# Patient Record
Sex: Male | Born: 1985 | Race: Black or African American | Hispanic: No | Marital: Single | Smoking: Current every day smoker
Health system: Southern US, Community
[De-identification: ages and names within clinical notes are randomized; demographics above are authoritative.]

---
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
## 2004-01-22 ENCOUNTER — Emergency Department (HOSPITAL_COMMUNITY): Admission: EM | Admit: 2004-01-22 | Discharge: 2004-01-22 | Payer: Self-pay | Admitting: Emergency Medicine

## 2004-02-11 ENCOUNTER — Emergency Department (HOSPITAL_COMMUNITY): Admission: EM | Admit: 2004-02-11 | Discharge: 2004-02-11 | Payer: Self-pay | Admitting: Family Medicine

## 2004-07-11 ENCOUNTER — Emergency Department (HOSPITAL_COMMUNITY): Admission: EM | Admit: 2004-07-11 | Discharge: 2004-07-11 | Payer: Self-pay | Admitting: Family Medicine

## 2004-11-12 ENCOUNTER — Emergency Department (HOSPITAL_COMMUNITY): Admission: EM | Admit: 2004-11-12 | Discharge: 2004-11-12 | Payer: Self-pay | Admitting: Emergency Medicine

## 2005-12-27 ENCOUNTER — Emergency Department (HOSPITAL_COMMUNITY): Admission: EM | Admit: 2005-12-27 | Discharge: 2005-12-27 | Payer: Self-pay | Admitting: Emergency Medicine

## 2006-10-13 ENCOUNTER — Emergency Department: Payer: Self-pay | Admitting: Unknown Physician Specialty

## 2009-12-07 ENCOUNTER — Emergency Department: Payer: Self-pay | Admitting: Emergency Medicine

## 2010-06-14 ENCOUNTER — Emergency Department: Payer: Self-pay | Admitting: Emergency Medicine

## 2011-12-25 ENCOUNTER — Emergency Department: Payer: Self-pay | Admitting: Emergency Medicine

## 2011-12-25 LAB — URINALYSIS, COMPLETE
Bacteria: NONE SEEN
Bilirubin,UR: NEGATIVE
Blood: NEGATIVE
Glucose,UR: NEGATIVE mg/dL (ref 0–75)
Ketone: NEGATIVE
Leukocyte Esterase: NEGATIVE
Nitrite: NEGATIVE
Ph: 5 (ref 4.5–8.0)
Protein: NEGATIVE
RBC,UR: <1 /HPF (ref 0–5)
Specific Gravity: 1.03 (ref 1.003–1.030)
Squamous Epithelial: NONE SEEN
WBC UR: 1 /HPF (ref 0–5)

## 2013-07-20 ENCOUNTER — Emergency Department: Payer: Self-pay | Admitting: Emergency Medicine

## 2013-09-23 ENCOUNTER — Emergency Department: Payer: Self-pay | Admitting: Emergency Medicine

## 2013-09-25 ENCOUNTER — Emergency Department: Payer: Self-pay | Admitting: Emergency Medicine

## 2014-03-21 ENCOUNTER — Emergency Department: Payer: Self-pay | Admitting: Internal Medicine

## 2014-10-05 ENCOUNTER — Encounter: Payer: Self-pay | Admitting: Emergency Medicine

## 2014-10-05 ENCOUNTER — Emergency Department
Admission: EM | Admit: 2014-10-05 | Discharge: 2014-10-05 | Disposition: A | Discharge status: Home / Self Care | Payer: Self-pay | Attending: Emergency Medicine | Admitting: Emergency Medicine

## 2014-10-05 DIAGNOSIS — R7309 Other abnormal glucose: Secondary | ICD-10-CM | POA: Insufficient documentation

## 2014-10-05 DIAGNOSIS — M2550 Pain in unspecified joint: Secondary | ICD-10-CM | POA: Insufficient documentation

## 2014-10-05 DIAGNOSIS — R7303 Prediabetes: Secondary | ICD-10-CM

## 2014-10-05 DIAGNOSIS — Z720 Tobacco use: Secondary | ICD-10-CM | POA: Insufficient documentation

## 2014-10-05 DIAGNOSIS — Z880 Allergy status to penicillin: Secondary | ICD-10-CM | POA: Insufficient documentation

## 2014-10-05 LAB — CBC WITH DIFFERENTIAL/PLATELET
BASOS PCT: 0 %
Basophils Absolute: 0 10*3/uL (ref 0–0.1)
Eosinophils Absolute: 0.1 10*3/uL (ref 0–0.7)
Eosinophils Relative: 2 %
HCT: 44.8 % (ref 40.0–52.0)
Hemoglobin: 14.5 g/dL (ref 13.0–18.0)
Lymphocytes Relative: 12 %
Lymphs Abs: 0.8 10*3/uL — ABNORMAL LOW (ref 1.0–3.6)
MCH: 27.1 pg (ref 26.0–34.0)
MCHC: 32.3 g/dL (ref 32.0–36.0)
MCV: 83.9 fL (ref 80.0–100.0)
Monocytes Absolute: 0.7 10*3/uL (ref 0.2–1.0)
Monocytes Relative: 11 %
Neutro Abs: 5.2 10*3/uL (ref 1.4–6.5)
Neutrophils Relative %: 75 %
Platelets: 205 10*3/uL (ref 150–440)
RBC: 5.34 MIL/uL (ref 4.40–5.90)
RDW: 13.9 % (ref 11.5–14.5)
WBC: 6.8 10*3/uL (ref 3.8–10.6)

## 2014-10-05 LAB — BASIC METABOLIC PANEL
Anion gap: 4 — ABNORMAL LOW (ref 5–15)
BUN: 11 mg/dL (ref 6–20)
CO2: 28 mmol/L (ref 22–32)
CREATININE: 1.18 mg/dL (ref 0.61–1.24)
Calcium: 8.9 mg/dL (ref 8.9–10.3)
Chloride: 108 mmol/L (ref 101–111)
GFR calc Af Amer: >60 mL/min (ref >60)
GFR calc non Af Amer: >60 mL/min (ref >60)
Glucose, Bld: 136 mg/dL — ABNORMAL HIGH (ref 65–99)
Potassium: 4.5 mmol/L (ref 3.5–5.1)
Sodium: 140 mmol/L (ref 135–145)

## 2014-10-05 MED ORDER — IBUPROFEN 800 MG PO TABS: 800.0000 mg | ORAL_TABLET | Freq: Three times a day (TID) | ORAL | Status: DC | PRN

## 2014-10-05 MED ORDER — KETOROLAC TROMETHAMINE 30 MG/ML IJ SOLN
30.0000 mg | Freq: Once | INTRAMUSCULAR | Status: AC
  Administered 2014-10-05: 30 mg via INTRAVENOUS
  Filled 2014-10-05: qty 1

## 2014-10-05 MED ORDER — ORPHENADRINE CITRATE 30 MG/ML IJ SOLN
60.0000 mg | Freq: Two times a day (BID) | INTRAMUSCULAR | Status: DC
  Administered 2014-10-05: 60 mg via INTRAVENOUS
  Filled 2014-10-05: qty 2

## 2014-10-05 MED ORDER — SODIUM CHLORIDE 0.9 % IV BOLUS (SEPSIS)
1000.0000 mL | Freq: Once | INTRAVENOUS | Status: AC
  Administered 2014-10-05: 1000 mL via INTRAVENOUS

## 2014-10-05 MED ORDER — CYCLOBENZAPRINE HCL 10 MG PO TABS: 10.0000 mg | ORAL_TABLET | Freq: Three times a day (TID) | ORAL | Status: DC | PRN

## 2014-10-05 MED ORDER — CLINDAMYCIN PHOSPHATE 600 MG/50ML IV SOLN: 600.0000 mg | Freq: Once | INTRAVENOUS | Status: DC

## 2014-10-05 NOTE — ED Provider Notes (Signed)
[facility] Emergency Department Provider Note  ____________________________________________  Time seen: Approximately 9:30 AM  I have reviewed the triage vital signs and the nursing notes.   HISTORY  Chief Complaint Muscle Pain    HPI Preston Edwards is a 29 y.o. male patient is complaining of generalized myalgia with no provocative incident. Patient said he was seen 2 days ago by the medical urgent care clinicand diagnosed with a viral illness. Patient say taken some ibuprofen and states child to go to work yesterday and was too weak to continue. Patient denies any headache, URI signs and symptoms, or nausea vomiting or diarrhea. Patient say his upper and lower extremities all weak and his whole body aches. Patient denies any headache vertigo or vision disturbance.   History reviewed. No pertinent past medical history.  There are no active problems to display for this patient.   History reviewed. No pertinent past surgical history.  Current Outpatient Rx  Name  Route  Sig  Dispense  Refill  . cyclobenzaprine (FLEXERIL) 10 MG tablet   Oral   Take 1 tablet (10 mg total) by mouth every 8 (eight) hours as needed for muscle spasms.   15 tablet   0   . ibuprofen (ADVIL,MOTRIN) 800 MG tablet   Oral   Take 1 tablet (800 mg total) by mouth every 8 (eight) hours as needed for moderate pain.   15 tablet   0     Allergies Penicillins  History reviewed. No pertinent family history.  Social History Social History  Substance Use Topics  . Smoking status: Current Every Day Smoker  . Smokeless tobacco: None  . Alcohol Use: Yes    Review of Systems Constitutional: No fever/chills Eyes: No visual changes. ENT: No sore throat. Cardiovascular: Denies chest pain. Respiratory: Denies shortness of breath. Gastrointestinal: No abdominal pain.  No nausea, no vomiting.  No diarrhea.  No constipation. Genitourinary: Negative for dysuria. Musculoskeletal:  Myalgia  Skin: Negative for rash. Neurological: Negative for headaches, focal weakness or numbness. Allergic/Immunilogical: Penicillin 10-point ROS otherwise negative.  ____________________________________________   PHYSICAL EXAM:  VITAL SIGNS: ED Triage Vitals  Enc Vitals Group     BP 10/05/14 0924 145/66 mmHg     Pulse Rate 10/05/14 0924 108     Resp 10/05/14 0924 18     Temp 10/05/14 0924 98.7 F (37.1 C)     Temp Source 10/05/14 0924 Oral     SpO2 10/05/14 0924 96 %     Weight 10/05/14 0924 247 lb (112.038 kg)     Height 10/05/14 0924 6' (1.829 m)     Head Cir --      Peak Flow --      Pain Score --      Pain Loc --      Pain Edu? --      Excl. in GC? --     Constitutional: Alert and oriented. Well appearing and in no acute distress. Eyes: Conjunctivae are normal. PERRL. EOMI. Head: Atraumatic. Nose: No congestion/rhinnorhea. Mouth/Throat: Mucous membranes are moist.  Oropharynx non-erythematous. Neck: No stridor.  No cervical spine tenderness to palpation. Hematological/Lymphatic/Immunilogical: No cervical lymphadenopathy. Cardiovascular: Normal rate, regular rhythm. Grossly normal heart sounds.  Good peripheral circulation. Respiratory: Normal respiratory effort.  No retractions. Lungs CTAB. Gastrointestinal: Soft and nontender. No distention. No abdominal bruits. No CVA tenderness. Musculoskeletal: No lower extremity tenderness nor edema.  No joint effusions. Neurologic:  Normal speech and language. No gross focal neurologic deficits are appreciated.  No gait instability. Skin:  Skin is warm, dry and intact. No rash noted. Psychiatric: Mood and affect are normal. Speech and behavior are normal.  ____________________________________________   LABS (all labs ordered are listed, but only abnormal results are displayed)  Labs Reviewed  BASIC METABOLIC PANEL - Abnormal; Notable for the following:    Glucose, Bld 136 (*)    Anion gap 4 (*)    All other components  within normal limits  CBC WITH DIFFERENTIAL/PLATELET - Abnormal; Notable for the following:    Lymphs Abs 0.8 (*)    All other components within normal limits   ____________________________________________  EKG   ____________________________________________  RADIOLOGY   ____________________________________________   PROCEDURES  Procedure(s) performed: None  Critical Care performed: No  ____________________________________________   INITIAL IMPRESSION / ASSESSMENT AND PLAN / ED COURSE  Pertinent labs & imaging results that were available during my care of the patient were reviewed by me and considered in my medical decision making (see chart for details).  Discussed the results with patient showing he might be prediabetic especially with his strong family history. Patient states noticed improvement after rehydration, Toradol, and Norflex. Patient will be discharged prescription for ibuprofen and Flexeril. Patient also [facility] for [facility] evaluation was elevated blood sugar. ____________________________________________   FINAL CLINICAL IMPRESSION(S) / ED DIAGNOSES  Final diagnoses:  Arthralgia  Prediabetes      Joni Reining, PA-C 10/05/14 1045  Jennye Moccasin, MD 10/05/14 1141

## 2014-10-05 NOTE — Discharge Instructions (Signed)

## 2014-10-05 NOTE — ED Notes (Signed)
Pt to ed with c/o muscle pain and low grade fever earlier in the week, was seen at [facility] for same.  Pt states yesterday started feeling pain in joints and muscles again, pt also reports abd pain.  States pain was worse in neck, arms, thighs and abd.  Pt without fever today. Alert and oriented and skin warm and dry.

## 2015-02-05 ENCOUNTER — Emergency Department
Admission: EM | Admit: 2015-02-05 | Discharge: 2015-02-05 | Disposition: A | Discharge status: Home / Self Care | Payer: Self-pay | Attending: Emergency Medicine | Admitting: Emergency Medicine

## 2015-02-05 DIAGNOSIS — F17200 Nicotine dependence, unspecified, uncomplicated: Secondary | ICD-10-CM | POA: Insufficient documentation

## 2015-02-05 DIAGNOSIS — Z880 Allergy status to penicillin: Secondary | ICD-10-CM | POA: Insufficient documentation

## 2015-02-05 DIAGNOSIS — Y990 Civilian activity done for income or pay: Secondary | ICD-10-CM | POA: Insufficient documentation

## 2015-02-05 DIAGNOSIS — Y9389 Activity, other specified: Secondary | ICD-10-CM | POA: Insufficient documentation

## 2015-02-05 DIAGNOSIS — S39012A Strain of muscle, fascia and tendon of lower back, initial encounter: Secondary | ICD-10-CM | POA: Insufficient documentation

## 2015-02-05 DIAGNOSIS — X500XXA Overexertion from strenuous movement or load, initial encounter: Secondary | ICD-10-CM | POA: Insufficient documentation

## 2015-02-05 DIAGNOSIS — Y9289 Other specified places as the place of occurrence of the external cause: Secondary | ICD-10-CM | POA: Insufficient documentation

## 2015-02-05 MED ORDER — IBUPROFEN 800 MG PO TABS: 800.0000 mg | ORAL_TABLET | Freq: Three times a day (TID) | ORAL | Status: DC | PRN

## 2015-02-05 MED ORDER — IBUPROFEN 800 MG PO TABS
800.0000 mg | ORAL_TABLET | Freq: Once | ORAL | Status: AC
  Administered 2015-02-05: 800 mg via ORAL
  Filled 2015-02-05: qty 1

## 2015-02-05 MED ORDER — DIAZEPAM 5 MG PO TABS
5.0000 mg | ORAL_TABLET | Freq: Once | ORAL | Status: AC
  Administered 2015-02-05: 5 mg via ORAL
  Filled 2015-02-05: qty 1

## 2015-02-05 MED ORDER — METHOCARBAMOL 500 MG PO TABS: 500.0000 mg | ORAL_TABLET | Freq: Four times a day (QID) | ORAL | Status: DC | PRN

## 2015-02-05 MED ORDER — HYDROCODONE-ACETAMINOPHEN 5-325 MG PO TABS
2.0000 | ORAL_TABLET | Freq: Once | ORAL | Status: AC
  Administered 2015-02-05: 2 via ORAL
  Filled 2015-02-05: qty 2

## 2015-02-05 NOTE — ED Notes (Signed)
Pt states he was lifting heavy lumbar today and began having left lower back pain with a hx of sciatica

## 2015-02-05 NOTE — Discharge Instructions (Signed)

## 2015-02-05 NOTE — ED Notes (Addendum)
Pt reports back pain at work when lifting wood at work.  This pain happens 2x per year and keeps him from being able to work. Pain did not start until today. Pt has not taken anything for pain. Has taken tylenol and Excedrin but nothing is helping.

## 2015-02-05 NOTE — ED Provider Notes (Signed)
[facility] Emergency Department Provider Note  ____________________________________________  Time seen: Approximately 1:53 PM  I have reviewed the triage vital signs and the nursing notes.   HISTORY  Chief Complaint Back Pain    HPI RAWN QUIROA is a 30 y.o. male who was lifting with work when his partner dropped the other end and the vibrations along with the way that would performed on his lower back. Patient states has a history of sciatica.   History reviewed. No pertinent past medical history.  There are no active problems to display for this patient.   History reviewed. No pertinent past surgical history.  Current Outpatient Rx  Name  Route  Sig  Dispense  Refill  . ibuprofen (ADVIL,MOTRIN) 800 MG tablet   Oral   Take 1 tablet (800 mg total) by mouth every 8 (eight) hours as needed.   30 tablet   0   . methocarbamol (ROBAXIN) 500 MG tablet   Oral   Take 1 tablet (500 mg total) by mouth every 6 (six) hours as needed for muscle spasms.   30 tablet   0     Allergies Penicillins  No family history on file.  Social History Social History  Substance Use Topics  . Smoking status: Current Every Day Smoker  . Smokeless tobacco: None  . Alcohol Use: Yes    Review of Systems Constitutional: No fever/chills Eyes: No visual changes. ENT: No sore throat. Cardiovascular: Denies chest pain. Respiratory: Denies shortness of breath. Gastrointestinal: No abdominal pain.  No nausea, no vomiting.  No diarrhea.  No constipation. Genitourinary: Negative for dysuria. Musculoskeletal: Positive for low back pain. Skin: Negative for rash. Neurological: Negative for headaches, focal weakness or numbness.  10-point ROS otherwise negative.  ____________________________________________   PHYSICAL EXAM:  VITAL SIGNS: ED Triage Vitals  Enc Vitals Group     BP 02/05/15 1328 125/73 mmHg     Pulse Rate 02/05/15 1328 102     Resp 02/05/15  1328 16     Temp 02/05/15 1328 97.7 F (36.5 C)     Temp Source 02/05/15 1328 Oral     SpO2 02/05/15 1328 97 %     Weight 02/05/15 1328 245 lb (111.131 kg)     Height 02/05/15 1328 6' (1.829 m)     Head Cir --      Peak Flow --      Pain Score 02/05/15 1328 8     Pain Loc --      Pain Edu? --      Excl. in GC? --     Constitutional: Alert and oriented. Well appearing and in no acute distress.   Cardiovascular: Normal rate, regular rhythm. Grossly normal heart sounds.  Good peripheral circulation. Respiratory: Normal respiratory effort.  No retractions. Lungs CTAB. Musculoskeletal: No lower extremity tenderness nor edema.  No joint effusions. Point tenderness to the right paraspinal lumbar area. Straight leg raise positive. Neurologic:  Normal speech and language. No gross focal neurologic deficits are appreciated. No gait instability. Skin:  Skin is warm, dry and intact. No rash noted. Psychiatric: Mood and affect are normal. Speech and behavior are normal.  ____________________________________________   LABS (all labs ordered are listed, but only abnormal results are displayed)  Labs Reviewed - No data to display    PROCEDURES  Procedure(s) performed: None  Critical Care performed: No  ____________________________________________   INITIAL IMPRESSION / ASSESSMENT AND PLAN / ED COURSE  Pertinent labs & imaging results that  were available during my care of the patient were reviewed by me and considered in my medical decision making (see chart for details).  Acute lumbar paraspinals muscular strain. Rx given for Robaxin 500 mg 4 times a day, Motrin 800 mg 3 times a day. Patient to follow-up with his PCP or return here with any worsening symptomology. Prior to discharge patient was given Valium 5 mg, Vicodin 5/325 #2 and Motrin 800 mg. Patient voices no other emergency medical complaints at this visit. ____________________________________________   FINAL CLINICAL  IMPRESSION(S) / ED DIAGNOSES  Final diagnoses:  Strain of lumbar paraspinal muscle, initial encounter      Evangeline Dakinharles M Eliane Hammersmith, PA-C 02/05/15 1409  Sharman CheekPhillip Stafford, MD 02/05/15 1523

## 2019-07-10 ENCOUNTER — Inpatient Hospital Stay: Payer: Self-pay

## 2019-07-10 ENCOUNTER — Inpatient Hospital Stay
Admission: EM | Admit: 2019-07-10 | Discharge: 2019-08-02 | DRG: 917 | Disposition: E | Payer: Self-pay | Attending: Internal Medicine | Admitting: Internal Medicine

## 2019-07-10 ENCOUNTER — Emergency Department: Payer: Self-pay

## 2019-07-10 DIAGNOSIS — Z66 Do not resuscitate: Secondary | ICD-10-CM | POA: Diagnosis present

## 2019-07-10 DIAGNOSIS — G931 Anoxic brain damage, not elsewhere classified: Secondary | ICD-10-CM | POA: Diagnosis present

## 2019-07-10 DIAGNOSIS — I255 Ischemic cardiomyopathy: Secondary | ICD-10-CM | POA: Diagnosis present

## 2019-07-10 DIAGNOSIS — E872 Acidosis, unspecified: Secondary | ICD-10-CM

## 2019-07-10 DIAGNOSIS — G92 Toxic encephalopathy: Secondary | ICD-10-CM | POA: Diagnosis present

## 2019-07-10 DIAGNOSIS — T405X1A Poisoning by cocaine, accidental (unintentional), initial encounter: Principal | ICD-10-CM

## 2019-07-10 DIAGNOSIS — N179 Acute kidney failure, unspecified: Secondary | ICD-10-CM | POA: Diagnosis present

## 2019-07-10 DIAGNOSIS — R571 Hypovolemic shock: Secondary | ICD-10-CM

## 2019-07-10 DIAGNOSIS — I609 Nontraumatic subarachnoid hemorrhage, unspecified: Secondary | ICD-10-CM | POA: Diagnosis present

## 2019-07-10 DIAGNOSIS — I6782 Cerebral ischemia: Secondary | ICD-10-CM

## 2019-07-10 DIAGNOSIS — Z20822 Contact with and (suspected) exposure to covid-19: Secondary | ICD-10-CM | POA: Diagnosis present

## 2019-07-10 DIAGNOSIS — T405X2A Poisoning by cocaine, intentional self-harm, initial encounter: Secondary | ICD-10-CM

## 2019-07-10 DIAGNOSIS — R739 Hyperglycemia, unspecified: Secondary | ICD-10-CM | POA: Diagnosis present

## 2019-07-10 DIAGNOSIS — Z6835 Body mass index (BMI) 35.0-35.9, adult: Secondary | ICD-10-CM

## 2019-07-10 DIAGNOSIS — R570 Cardiogenic shock: Secondary | ICD-10-CM

## 2019-07-10 DIAGNOSIS — E875 Hyperkalemia: Secondary | ICD-10-CM

## 2019-07-10 DIAGNOSIS — F1910 Other psychoactive substance abuse, uncomplicated: Secondary | ICD-10-CM | POA: Diagnosis present

## 2019-07-10 DIAGNOSIS — F17200 Nicotine dependence, unspecified, uncomplicated: Secondary | ICD-10-CM | POA: Diagnosis present

## 2019-07-10 DIAGNOSIS — J9601 Acute respiratory failure with hypoxia: Secondary | ICD-10-CM | POA: Diagnosis present

## 2019-07-10 DIAGNOSIS — J9602 Acute respiratory failure with hypercapnia: Secondary | ICD-10-CM | POA: Diagnosis present

## 2019-07-10 DIAGNOSIS — G936 Cerebral edema: Secondary | ICD-10-CM | POA: Diagnosis present

## 2019-07-10 DIAGNOSIS — I4901 Ventricular fibrillation: Secondary | ICD-10-CM | POA: Diagnosis present

## 2019-07-10 DIAGNOSIS — I468 Cardiac arrest due to other underlying condition: Secondary | ICD-10-CM | POA: Diagnosis present

## 2019-07-10 DIAGNOSIS — I469 Cardiac arrest, cause unspecified: Secondary | ICD-10-CM | POA: Diagnosis present

## 2019-07-10 LAB — BLOOD GAS, ARTERIAL
Acid-base deficit: 10.6 mmol/L — ABNORMAL HIGH (ref 0.0–2.0)
Acid-base deficit: 13.5 mmol/L — ABNORMAL HIGH (ref 0.0–2.0)
Bicarbonate: 22.1 mmol/L (ref 20.0–28.0)
Bicarbonate: 17.4 mmol/L — ABNORMAL LOW (ref 20.0–28.0)
FIO2: 0.95
FIO2: 1
MECHVT: 450 mL
MECHVT: 450 mL
Mechanical Rate: 18
O2 Saturation: 99.5 %
O2 Saturation: 60.9 %
PEEP: 5 cmH2O
PEEP: 12 cmH2O
Patient temperature: 37
Patient temperature: 37
RATE: 18 resp/min
RATE: 22 resp/min
pCO2 arterial: 80 mmHg (ref 32.0–48.0)
pCO2 arterial: 63 mmHg — ABNORMAL HIGH (ref 32.0–48.0)
pH, Arterial: 7.05 — CL (ref 7.350–7.450)
pH, Arterial: 7.05 — CL (ref 7.350–7.450)
pO2, Arterial: 223 mmHg — ABNORMAL HIGH (ref 83.0–108.0)
pO2, Arterial: 47 mmHg — ABNORMAL LOW (ref 83.0–108.0)

## 2019-07-10 LAB — CBC
HCT: 49.8 % (ref 39.0–52.0)
Hemoglobin: 15 g/dL (ref 13.0–17.0)
MCH: 29.4 pg (ref 26.0–34.0)
MCHC: 30.1 g/dL (ref 30.0–36.0)
MCV: 97.6 fL (ref 80.0–100.0)
Platelets: 182 10*3/uL (ref 150–400)
RBC: 5.1 MIL/uL (ref 4.22–5.81)
RDW: 14.9 % (ref 11.5–15.5)
WBC: 20.4 10*3/uL — ABNORMAL HIGH (ref 4.0–10.5)
nRBC: 0.1 % (ref 0.0–0.2)

## 2019-07-10 LAB — URINE DRUG SCREEN, QUALITATIVE (ARMC ONLY)
Amphetamines, Ur Screen: NOT DETECTED
Barbiturates, Ur Screen: NOT DETECTED
Benzodiazepine, Ur Scrn: NOT DETECTED
Cannabinoid 50 Ng, Ur ~~LOC~~: POSITIVE — AB
Cocaine Metabolite,Ur ~~LOC~~: POSITIVE — AB
MDMA (Ecstasy)Ur Screen: NOT DETECTED
Methadone Scn, Ur: NOT DETECTED
Opiate, Ur Screen: POSITIVE — AB
Phencyclidine (PCP) Ur S: NOT DETECTED
Tricyclic, Ur Screen: NOT DETECTED

## 2019-07-10 LAB — TROPONIN I (HIGH SENSITIVITY)
Troponin I (High Sensitivity): 102 ng/L (ref <18)
Troponin I (High Sensitivity): 1666 ng/L (ref <18)

## 2019-07-10 LAB — BASIC METABOLIC PANEL
Anion gap: 11 (ref 5–15)
BUN: 12 mg/dL (ref 6–20)
CO2: 33 mmol/L — ABNORMAL HIGH (ref 22–32)
Calcium: 8.3 mg/dL — ABNORMAL LOW (ref 8.9–10.3)
Chloride: 102 mmol/L (ref 98–111)
Creatinine, Ser: 1.67 mg/dL — ABNORMAL HIGH (ref 0.61–1.24)
GFR calc Af Amer: >60 mL/min (ref >60)
GFR calc non Af Amer: 52 mL/min — ABNORMAL LOW (ref >60)
Glucose, Bld: 168 mg/dL — ABNORMAL HIGH (ref 70–99)
Potassium: 6.6 mmol/L (ref 3.5–5.1)
Sodium: 146 mmol/L — ABNORMAL HIGH (ref 135–145)

## 2019-07-10 LAB — SARS CORONAVIRUS 2 BY RT PCR (HOSPITAL ORDER, PERFORMED IN ~~LOC~~ HOSPITAL LAB): SARS Coronavirus 2: NEGATIVE

## 2019-07-10 LAB — COMPREHENSIVE METABOLIC PANEL
ALT: 84 U/L — ABNORMAL HIGH (ref 0–44)
AST: 172 U/L — ABNORMAL HIGH (ref 15–41)
Albumin: 2.9 g/dL — ABNORMAL LOW (ref 3.5–5.0)
Alkaline Phosphatase: 96 U/L (ref 38–126)
Anion gap: 18 — ABNORMAL HIGH (ref 5–15)
BUN: 8 mg/dL (ref 6–20)
CO2: 22 mmol/L (ref 22–32)
Calcium: 9.3 mg/dL (ref 8.9–10.3)
Chloride: 97 mmol/L — ABNORMAL LOW (ref 98–111)
Creatinine, Ser: 1.77 mg/dL — ABNORMAL HIGH (ref 0.61–1.24)
GFR calc Af Amer: 56 mL/min — ABNORMAL LOW (ref >60)
GFR calc non Af Amer: 49 mL/min — ABNORMAL LOW (ref >60)
Glucose, Bld: 435 mg/dL — ABNORMAL HIGH (ref 70–99)
Potassium: 5.8 mmol/L — ABNORMAL HIGH (ref 3.5–5.1)
Sodium: 137 mmol/L (ref 135–145)
Total Bilirubin: 1.3 mg/dL — ABNORMAL HIGH (ref 0.3–1.2)
Total Protein: 6 g/dL — ABNORMAL LOW (ref 6.5–8.1)

## 2019-07-10 LAB — URINALYSIS, COMPLETE (UACMP) WITH MICROSCOPIC
Bilirubin Urine: NEGATIVE
Glucose, UA: >=500 mg/dL — AB
Hgb urine dipstick: NEGATIVE
Ketones, ur: NEGATIVE mg/dL
Leukocytes,Ua: NEGATIVE
Nitrite: NEGATIVE
Protein, ur: 100 mg/dL — AB
Specific Gravity, Urine: 1.016 (ref 1.005–1.030)
pH: 6 (ref 5.0–8.0)

## 2019-07-10 LAB — PHOSPHORUS: Phosphorus: 7.1 mg/dL — ABNORMAL HIGH (ref 2.5–4.6)

## 2019-07-10 LAB — MAGNESIUM: Magnesium: 2.4 mg/dL (ref 1.7–2.4)

## 2019-07-10 LAB — LACTIC ACID, PLASMA: Lactic Acid, Venous: 4 mmol/L (ref 0.5–1.9)

## 2019-07-10 LAB — GLUCOSE, CAPILLARY: Glucose-Capillary: 268 mg/dL — ABNORMAL HIGH (ref 70–99)

## 2019-07-10 LAB — ACETAMINOPHEN LEVEL: Acetaminophen (Tylenol), Serum: <10 ug/mL — ABNORMAL LOW (ref 10–30)

## 2019-07-10 LAB — MRSA PCR SCREENING: MRSA by PCR: NEGATIVE

## 2019-07-10 LAB — SALICYLATE LEVEL: Salicylate Lvl: <7 mg/dL — ABNORMAL LOW (ref 7.0–30.0)

## 2019-07-10 LAB — AMMONIA: Ammonia: 84 umol/L — ABNORMAL HIGH (ref 9–35)

## 2019-07-10 LAB — ETHANOL: Alcohol, Ethyl (B): <10 mg/dL (ref <10)

## 2019-07-10 MED ORDER — INSULIN ASPART 100 UNIT/ML IV SOLN
10.0000 [IU] | INTRAVENOUS | Status: AC
  Administered 2019-07-11: 10 [IU] via INTRAVENOUS
  Filled 2019-07-10: qty 0.1

## 2019-07-10 MED ORDER — DOCUSATE SODIUM 50 MG/5ML PO LIQD: 100.0000 mg | Freq: Two times a day (BID) | ORAL | Status: DC

## 2019-07-10 MED ORDER — ONDANSETRON HCL 4 MG/2ML IJ SOLN: 4.0000 mg | Freq: Four times a day (QID) | INTRAMUSCULAR | Status: DC | PRN

## 2019-07-10 MED ORDER — POLYETHYLENE GLYCOL 3350 17 G PO PACK: 17.0000 g | PACK | Freq: Every day | ORAL | Status: DC

## 2019-07-10 MED ORDER — CHLORHEXIDINE GLUCONATE CLOTH 2 % EX PADS
6.0000 | MEDICATED_PAD | Freq: Every day | CUTANEOUS | Status: DC
  Administered 2019-07-10: 6 via TOPICAL

## 2019-07-10 MED ORDER — INSULIN ASPART 100 UNIT/ML ~~LOC~~ SOLN
0.0000 [IU] | SUBCUTANEOUS | Status: DC
  Administered 2019-07-10: 8 [IU] via SUBCUTANEOUS
  Administered 2019-07-11: 2 [IU] via SUBCUTANEOUS
  Filled 2019-07-10 (×3): qty 1

## 2019-07-10 MED ORDER — FENTANYL CITRATE (PF) 100 MCG/2ML IJ SOLN: 50.0000 ug | INTRAMUSCULAR | Status: DC | PRN

## 2019-07-10 MED ORDER — DOCUSATE SODIUM 100 MG PO CAPS: 100.0000 mg | ORAL_CAPSULE | Freq: Two times a day (BID) | ORAL | Status: DC | PRN

## 2019-07-10 MED ORDER — SODIUM CHLORIDE 0.9% FLUSH: 3.0000 mL | INTRAVENOUS | Status: DC | PRN

## 2019-07-10 MED ORDER — SODIUM CHLORIDE 0.9 % IV BOLUS
1000.0000 mL | Freq: Once | INTRAVENOUS | Status: AC
  Administered 2019-07-10: 1000 mL via INTRAVENOUS

## 2019-07-10 MED ORDER — NOREPINEPHRINE 4 MG/250ML-% IV SOLN
0.0000 ug/min | INTRAVENOUS | Status: DC
  Administered 2019-07-10: 2 ug/min via INTRAVENOUS
  Administered 2019-07-10: 20 ug/min via INTRAVENOUS
  Filled 2019-07-10 (×2): qty 250

## 2019-07-10 MED ORDER — DEXTROSE 50 % IV SOLN
12.5000 g | INTRAVENOUS | Status: AC
  Administered 2019-07-11: 12.5 g via INTRAVENOUS
  Filled 2019-07-10: qty 50

## 2019-07-10 MED ORDER — FAMOTIDINE IN NACL 20-0.9 MG/50ML-% IV SOLN
20.0000 mg | Freq: Two times a day (BID) | INTRAVENOUS | Status: DC
  Administered 2019-07-10 – 2019-07-11 (×2): 20 mg via INTRAVENOUS
  Filled 2019-07-10 (×2): qty 50

## 2019-07-10 MED ORDER — DOCUSATE SODIUM 50 MG/5ML PO LIQD
100.0000 mg | Freq: Two times a day (BID) | ORAL | Status: DC
  Filled 2019-07-10: qty 10

## 2019-07-10 MED ORDER — NOREPINEPHRINE 16 MG/250ML-% IV SOLN
0.0000 ug/min | INTRAVENOUS | Status: DC
  Administered 2019-07-10 – 2019-07-11 (×3): 40 ug/min via INTRAVENOUS
  Filled 2019-07-10 (×3): qty 250

## 2019-07-10 MED ORDER — LABETALOL HCL 5 MG/ML IV SOLN
10.0000 mg | Freq: Once | INTRAVENOUS | Status: AC
  Administered 2019-07-10: 10 mg via INTRAVENOUS

## 2019-07-10 MED ORDER — STERILE WATER FOR INJECTION IV SOLN
INTRAVENOUS | Status: DC
  Filled 2019-07-10 (×5): qty 850

## 2019-07-10 MED ORDER — MIDAZOLAM HCL 2 MG/2ML IJ SOLN: 2.0000 mg | INTRAMUSCULAR | Status: DC | PRN

## 2019-07-10 MED ORDER — FENTANYL BOLUS VIA INFUSION
50.0000 ug | INTRAVENOUS | Status: DC | PRN
  Administered 2019-07-10: 50 ug via INTRAVENOUS
  Filled 2019-07-10: qty 50

## 2019-07-10 MED ORDER — ACETAMINOPHEN 325 MG PO TABS
650.0000 mg | ORAL_TABLET | ORAL | Status: DC | PRN
  Administered 2019-07-11: 650 mg via ORAL
  Filled 2019-07-10: qty 2

## 2019-07-10 MED ORDER — FENTANYL 2500MCG IN NS 250ML (10MCG/ML) PREMIX INFUSION: 50.0000 ug/h | INTRAVENOUS | Status: DC

## 2019-07-10 MED ORDER — VECURONIUM BROMIDE 10 MG IV SOLR
5.0000 mg | Freq: Once | INTRAVENOUS | Status: AC
  Administered 2019-07-10: 5 mg via INTRAVENOUS

## 2019-07-10 MED ORDER — POLYETHYLENE GLYCOL 3350 17 G PO PACK: 17.0000 g | PACK | Freq: Every day | ORAL | Status: DC | PRN

## 2019-07-10 MED ORDER — SODIUM CHLORIDE 0.9 % IV SOLN
250.0000 mL | INTRAVENOUS | Status: DC | PRN
  Administered 2019-07-10: 250 mL via INTRAVENOUS

## 2019-07-10 MED ORDER — FENTANYL CITRATE (PF) 100 MCG/2ML IJ SOLN: 50.0000 ug | Freq: Once | INTRAMUSCULAR | Status: DC

## 2019-07-10 MED ORDER — VASOPRESSIN 20 UNIT/ML IV SOLN
0.0300 [IU]/min | INTRAVENOUS | Status: DC
  Administered 2019-07-10: 0.03 [IU]/min via INTRAVENOUS
  Filled 2019-07-10 (×2): qty 2

## 2019-07-10 MED ORDER — SODIUM BICARBONATE 8.4 % IV SOLN
100.0000 meq | Freq: Once | INTRAVENOUS | Status: AC
  Administered 2019-07-10: 100 meq via INTRAVENOUS
  Filled 2019-07-10: qty 100

## 2019-07-10 MED ORDER — PHENYLEPHRINE CONCENTRATED 100MG/250ML (0.4 MG/ML) INFUSION SIMPLE
0.0000 ug/min | INTRAVENOUS | Status: DC
  Administered 2019-07-10: 20 ug/min via INTRAVENOUS
  Administered 2019-07-11: 400 ug/min via INTRAVENOUS
  Administered 2019-07-11: 350 ug/min via INTRAVENOUS
  Filled 2019-07-10 (×3): qty 250

## 2019-07-10 MED ORDER — FENTANYL 2500MCG IN NS 250ML (10MCG/ML) PREMIX INFUSION
INTRAVENOUS | Status: AC
  Administered 2019-07-10: 50 ug/h via INTRAVENOUS
  Filled 2019-07-10: qty 250

## 2019-07-10 MED ORDER — SODIUM BICARBONATE 8.4 % IV SOLN
100.0000 meq | Freq: Once | INTRAVENOUS | Status: AC
  Administered 2019-07-10: 100 meq via INTRAVENOUS

## 2019-07-10 MED ORDER — HEPARIN SODIUM (PORCINE) 5000 UNIT/ML IJ SOLN
5000.0000 [IU] | Freq: Three times a day (TID) | INTRAMUSCULAR | Status: DC
  Administered 2019-07-10: 5000 [IU] via SUBCUTANEOUS
  Filled 2019-07-10: qty 1

## 2019-07-10 MED ORDER — SODIUM CHLORIDE 0.9% FLUSH
3.0000 mL | Freq: Two times a day (BID) | INTRAVENOUS | Status: DC
  Administered 2019-07-10 – 2019-07-11 (×2): 3 mL via INTRAVENOUS

## 2019-07-10 NOTE — ED Provider Notes (Signed)
[facility] Emergency Department Provider Note  Time seen: 6:24 PM  I have reviewed the triage vital signs and the nursing notes.   HISTORY  Chief Complaint Cardiac arrest  HPI Preston Edwards is a 34 y.o. male with no known past medical history presents to the emergency department status post cardiac arrest.  According to EMS report they found the patient face down, CPR had been initiated by fire department upon their arrival.  Patient received several rounds of epinephrine, Narcan, an amp of calcium, amp of bicarb and defibrillated twice in V. fib.  Patient regained pulses and was brought to the emergency department via emergency traffic.  Here patient arrives unresponsive with pulse intact, fixed and dilated pupils.  Breaths are assisted with bagging via a Combitube.   No past medical history on file.  There are no problems to display for this patient.   No past surgical history on file.  Prior to Admission medications   Medication Sig Start Date End Date Taking? Authorizing Provider  ibuprofen (ADVIL,MOTRIN) 800 MG tablet Take 1 tablet (800 mg total) by mouth every 8 (eight) hours as needed. 02/05/15   Beers, Pierce Crane, PA-C  methocarbamol (ROBAXIN) 500 MG tablet Take 1 tablet (500 mg total) by mouth every 6 (six) hours as needed for muscle spasms. 02/05/15   Beers, Pierce Crane, PA-C    Allergies  Allergen Reactions  . Penicillins Other (See Comments)    Pt states he was just told this as a child, has never taken it as far as he knows.     No family history on file.  Social History Social History   Tobacco Use  . Smoking status: Current Every Day Smoker  Substance Use Topics  . Alcohol use: Yes  . Drug use: Not on file    Review of Systems Unable to obtain an adequate/accurate review of systems secondary to cardiac arrest ____________________________________________   PHYSICAL EXAM:  VITAL SIGNS: ED Triage Vitals  Enc Vitals Group     BP  07/26/2019 1812 (!) 90/38     Pulse Rate 07/28/2019 1812 (!) 115     Resp 07/09/2019 1812 (!) 24     Temp --      Temp src --      SpO2 07/28/2019 1812 96 %     Weight 07/26/2019 1817 285 lb 6.4 oz (129.5 kg)     Height 07/26/2019 1817 6\' 2"  (1.88 m)     Head Circumference --      Peak Flow --      Pain Score --      Pain Loc --      Pain Edu? --      Excl. in [place]? --    Constitutional: Patient is unresponsive to painful stimuli.  Currently being bagged with a Combitube in place.  Occasional agonal respiration Eyes: 4 mm fixed pupils bilaterally. ENT      Head: Normocephalic and atraumatic.      Mouth/Throat: Mucous membranes are moist. Cardiovascular: Normal rate, regular rhythm around 100 bpm. Respiratory: Agonal respirations, being bagged with a Combitube in place. Gastrointestinal: Soft, nondistended.  Obese. Musculoskeletal: Patient has bilateral IO access to the tibias. Neurologic: Unresponsive Skin:  Skin is warm, dry Psychiatric: Unresponsive  ____________________________________________    EKG  EKG viewed and interpreted by myself shows a sinus tachycardia 116 bpm the narrow QRS, normal axis, normal intervals nonspecific ST changes.  ____________________________________________    RADIOLOGY  X-ray shows endotracheal tube  3.3 cm above carina. CT scan head shows diffuse hypoxic ischemic injury with swelling.  ____________________________________________   INITIAL IMPRESSION / ASSESSMENT AND PLAN / ED COURSE  Pertinent labs & imaging results that were available during my care of the patient were reviewed by me and considered in my medical decision making (see chart for details).   Patient presents to the emergency department status post cardiac arrest.  Patient unresponsive upon arrival.  Is taken occasional agonal respiration via Combitube.  Combitube removed and the patient intubated by myself upon arrival.  Maintain pulse around 100 120 bpm.  Patient unfortunately  appears to have fixed dilated pupils upon arrival.  Blood pressure is somewhat hypotensive we will start on norepinephrine.  We will continue with IV hydration.  We will obtain chest x-ray to verify tube placement we will obtain CT imaging of the head as we do not have a clear cause for the arrest at this time.  Lab work is pending.  Attempting to contact family members.  I spoke to Dr. Belia Heman of ICU who will be admitting to his service.  Patient has become hypertensive, norepinephrine was discontinued patient remained hypertensive.  Patient given 10 mg of IV labetalol as his blood pressure currently is 213/144.  CT scan pending to evaluate for edema/herniation.  Continues to be maintaining spontaneous pulse.  Lab work shows hyperkalemia patient receiving IV fluids.  Elevated troponin to 102 however very likely due to prolonged CPR.  Liver function test elevated again very likely due to prolonged downtime.  Preston Edwards was evaluated in Emergency Department on 03-Aug-2019 for the symptoms described in the history of present illness. He was evaluated in the context of the global COVID-19 pandemic, which necessitated consideration that the patient might be at risk for infection with the SARS-CoV-2 virus that causes COVID-19. Institutional protocols and algorithms that pertain to the evaluation of patients at risk for COVID-19 are in a state of rapid change based on information released by regulatory bodies including the CDC and federal and state organizations. These policies and algorithms were followed during the patient's care in the ED.  CRITICAL CARE Performed by: Minna Antis   Total critical care time: 45 minutes  Critical care time was exclusive of separately billable procedures and treating other patients.  Critical care was necessary to treat or prevent imminent or life-threatening deterioration.  Critical care was time spent personally by me on the following activities: development of  treatment plan with patient and/or surrogate as well as nursing, discussions with consultants, evaluation of patient's response to treatment, examination of patient, obtaining history from patient or surrogate, ordering and performing treatments and interventions, ordering and review of laboratory studies, ordering and review of radiographic studies, pulse oximetry and re-evaluation of patient's condition.   Attempted to contact the only contact listed Tresa Moore, but number goes straight to voicemail and her voice mailbox is full.  Nurse is attempting to find other contact information. ____________________________________________   FINAL CLINICAL IMPRESSION(S) / ED DIAGNOSES  Cardiac arrest   Minna Antis, MD 08-03-19 2308

## 2019-07-10 NOTE — ED Notes (Addendum)
EDP informed levo paused due to high blood pressures. Critical troponin 102 told to EDP, no new orders.

## 2019-07-10 NOTE — ED Notes (Signed)
8 ETT 26 at lip by EDP at bedside.

## 2019-07-10 NOTE — ED Triage Notes (Addendum)
[place] [facility] from [place] where he was found facedown. CPR started by bystanders. Upon EMS arrival pt was in PEA. Pt received total of 4 epi, 3 narcan, 300mg  amiodarone, calcium chloride, sodium bicarb and 2 shocks given prior to arrival. cbg 425. Tubed on arrival, pt with minimal breaths taken by himself and being bagged on arrival. RIGHT IO  And LEFT IO started by EMS. NS infusing through RIGHT IO  Left IO removed by staff on arrival, not working correctly.

## 2019-07-10 NOTE — Progress Notes (Signed)
Called to pt room for low SPO2 and increased secretions. Pt spo2 85% with pink frothy secretions. Suctioned pt, increased to 100% Fio2, changed HME, coarse crackles. MD came to bedside repeat chest xray ordered.

## 2019-07-10 NOTE — ED Notes (Signed)
[employer] at bedside 

## 2019-07-10 NOTE — ED Notes (Signed)
Report provided to CCU. Respiratory called for transport of vent.

## 2019-07-10 NOTE — ED Notes (Addendum)
Pt arrives with white tennis shoes, red shorts, blue boxers and red tshirt. All of clothing cut on arrival and placed in patient belonging bag at bedside to ensure quick care and patient safety.

## 2019-07-10 NOTE — H&P (Signed)
Name: Preston Edwards MRN: [id number] DOB: 1986-01-19     CONSULTATION DATE: 07/07/2019  REFERRING MD : Preston Edwards  CHIEF COMPLAINT:  Acute cardiac arrest  HISTORY OF PRESENT ILLNESS:   34 y.o. male with no known past medical history presents to the emergency department status post cardiac arrest.  According to EMS report they [place] the patient [place] a park-UNKNOWN DOWNTIME pt pulseless with initial cardiac rhythm PEA CPR initiated by Fire Department with ROSC 40 minutes following initiation of ACLS protocol.  During ACLS protocol pt received several rounds of epinephrine, Narcan, an amp of calcium, amp of bicarb and defibrillated twice in V. fib.  Upon arrival to [facility] ER pt unresponsive with pulses intact, however bilateral pupils were fixed and dilated.  He required mechanical intubation.  Lab results revealed K+ 5.8, chloride 97, glucose 435, creatinine 1.77, anion gap 18, albumin 2.9, ast 172, alt 84, troponin 102, wbc 20.4, acetaminophen <29, salicylate <9.2, urine drug screen positive for cocaine/cannabinoid/opiate and abg pH 7.05/pCO2 80/pO2 223/acid-base deficit 10.6.  He was subsequently admitted to ICU for additional workup treatment.    SIGNIFICANT EVENTS/RESULTS: 06/8: Pt admitted to ICU with suspected drug overdose requiring mechanical intubation  06/8: CT Head revealed diffuse hypoxic ischemic injury with brain swelling and low-density resulting in pseudo subarachnoid hemorrhage sign.  PAST MEDICAL HISTORY :   has no past medical history on file.  has no past surgical history on file. Prior to Admission medications   Not on File   Allergies  Allergen Reactions   Penicillins Other (See Comments)    Pt states he was just told this as a child, has never taken it as far as he knows.     FAMILY HISTORY:  family history is not on file. SOCIAL HISTORY:  reports that he has been smoking. He does not have any smokeless tobacco history on file. He reports current  alcohol use.  REVIEW OF SYSTEMS:   Unable to obtain due to critical illness      Estimated body mass index is 36.64 kg/m as calculated from the following:   Height as of this encounter: 6' 2"  (1.88 m).   Weight as of this encounter: 129.5 kg.  VITAL SIGNS: Temp:  [95.3 F (35.2 C)] 95.3 F (35.2 C) (06/08 1830) Pulse Rate:  [115-125] 125 (06/08 1830) Resp:  [19-24] 19 (06/08 1830) BP: (90-139)/(38-70) 139/70 (06/08 1830) SpO2:  [93 %-99 %] 93 % (06/08 1830) FiO2 (%):  [95 %] 95 % (06/08 1810) Weight:  [129.5 kg] 129.5 kg (06/08 1817)   No intake/output data recorded. No intake/output data recorded.   SpO2: 93 % FiO2 (%): 95 %  Physical Examination:  GENERAL:critically ill appearing, NAD mechanically intubated  HEAD: Normocephalic, atraumatic.  EYES: unresponsive, not following commands, pupils unequal left pupil 3 mm irregular non reactive, right pupil 3 mm round non reactive  NECK: Supple. No JVD.  PULMONARY: rhonchi and diminished throughout, even non labored  CARDIOVASCULAR: sinus tach, no R/G GASTROINTESTINAL: +BS x4, distended, obese, soft MUSCULOSKELETAL: No swelling, clubbing, or edema.  SKIN:intact,warm,dry  Indwelling Urinary Catheter continued, requirement due to   Reason to continue Indwelling Urinary Catheter strict Intake/Output monitoring for hemodynamic instability         Ventilator continued, requirement due to severe respiratory failure   Ventilator Sedation RASS 0 to -1      ASSESSMENT AND PLAN SYNOPSIS   Severe ACUTE Hypoxic and Hypercapnic Respiratory Failure likely secondary to aspiration  -continue Full MV  support -continue Bronchodilator Therapy -Wean Fio2 and PEEP as tolerated -will perform SAT/SBT when respiratory parameters are met -VAP/VENT bundle implementation -trend wbc and monitor fever curve -trend pct and lactic acid   -will start unasyn   PEA arrest suspected secondary to cocaine poisoning and/or heroin  overdose Cardiogenic and hypovolemic shock  -continuous telemetry monitoring  -trend troponin's  -aggressive fluid resuscitation and prn levophed, vasopressin, and/or neo-synephrine to maintain map >65  Acute renal failure with hyperkalemia and severe metabolic acidosis secondary to cardiogenic shock  -trend bmp -replace electrolytes as indicated  -monitor uop -avoid nephrotoxic medications  -sodium bicarb gtt @150  ml/hr  -will trend vbg's    Hyperglycemia  -CBG's q4hrs  -SSI   Acute encephalopathy secondary to diffuse hypoxic ischemic injury with brain swelling due to prolonged downtime in setting of cardiac arrest and suspected drug overdose Mechanical ventilation pain/discomfort  -Maintain RASS goal 0 to -1 -Prn fentanyl to maintain RASS goal  -EEG pending and neurology consulted appreciate input    Best Practice: VTE px: subq heparin  SUP px: iv pepcid  Diet: will keep NPO for now  -After difficulty contacting pts next of kin his niece Preston Edwards called requesting an update regarding pts condition.  He does have a brother, however Preston Edwards does not have any contact with him and is unable to provide contact information.  Preston Edwards asked "did he overdose on drugs" because according to her the pt has a hx of previous drug overdoses and has a significant polysubstance abuse hx (cocaine, heroin, and marijuana).  I discussed poor prognosis and CT Head findings which indicate extremely poor prognosis, and it is unlikely the pt will have a meaningful recovery.  I also informed Preston Edwards her uncle is HIGH risk for cardiac arrest and sudden death.  She stated she understands pts poor prognosis, and she already has a flight scheduled for Thursday 06/10 which was already preplanned.  She plans to come to the hospital when she arrives in [place].  Pt to remain a FULL CODE for now   Preston Edwards, [place] Pager [phone] (please enter 7 digits) PCCM Consult  Pager [phone] (please enter 7 digits)

## 2019-07-10 NOTE — Progress Notes (Signed)
CDS referral made, CDS will continue to follow

## 2019-07-10 NOTE — ED Notes (Signed)
Per officer Arco with [employer], family has not been able to be located or reached at this time. Officer will call with updates on this matter.

## 2019-07-10 NOTE — ED Notes (Signed)
Pt with heavy secretions seen in endotracheal tube. Respiratory called for suction.

## 2019-07-10 NOTE — Progress Notes (Signed)
Pt transported to CT and ICU 3 on the vent without incident. Pt remains on the vent and tol well at this time. Report given to ICU RT.

## 2019-07-11 ENCOUNTER — Encounter: Payer: Self-pay | Admitting: Internal Medicine

## 2019-07-11 ENCOUNTER — Other Ambulatory Visit: Payer: Self-pay

## 2019-07-11 ENCOUNTER — Inpatient Hospital Stay: Payer: Self-pay

## 2019-07-11 DIAGNOSIS — G931 Anoxic brain damage, not elsewhere classified: Secondary | ICD-10-CM

## 2019-07-11 DIAGNOSIS — N170 Acute kidney failure with tubular necrosis: Secondary | ICD-10-CM

## 2019-07-11 LAB — BASIC METABOLIC PANEL
Anion gap: 15 (ref 5–15)
BUN: 19 mg/dL (ref 6–20)
CO2: 31 mmol/L (ref 22–32)
Calcium: 8.4 mg/dL — ABNORMAL LOW (ref 8.9–10.3)
Chloride: 99 mmol/L (ref 98–111)
Creatinine, Ser: 2.12 mg/dL — ABNORMAL HIGH (ref 0.61–1.24)
GFR calc Af Amer: 45 mL/min — ABNORMAL LOW (ref >60)
GFR calc non Af Amer: 39 mL/min — ABNORMAL LOW (ref >60)
Glucose, Bld: 120 mg/dL — ABNORMAL HIGH (ref 70–99)
Potassium: 5.2 mmol/L — ABNORMAL HIGH (ref 3.5–5.1)
Sodium: 145 mmol/L (ref 135–145)

## 2019-07-11 LAB — BLOOD GAS, ARTERIAL
Acid-Base Excess: 9.1 mmol/L — ABNORMAL HIGH (ref 0.0–2.0)
Bicarbonate: 30.9 mmol/L — ABNORMAL HIGH (ref 20.0–28.0)
FIO2: 0.6
O2 Saturation: 92.4 %
PEEP: 12 cmH2O
Patient temperature: 37
Pressure control: 30 cmH2O
RATE: 30 resp/min
pCO2 arterial: 33 mmHg (ref 32.0–48.0)
pH, Arterial: 7.58 — ABNORMAL HIGH (ref 7.350–7.450)
pO2, Arterial: 54 mmHg — ABNORMAL LOW (ref 83.0–108.0)

## 2019-07-11 LAB — GLUCOSE, CAPILLARY
Glucose-Capillary: 122 mg/dL — ABNORMAL HIGH (ref 70–99)
Glucose-Capillary: 129 mg/dL — ABNORMAL HIGH (ref 70–99)
Glucose-Capillary: 146 mg/dL — ABNORMAL HIGH (ref 70–99)
Glucose-Capillary: 96 mg/dL (ref 70–99)

## 2019-07-11 LAB — CBC
HCT: 63.2 % — ABNORMAL HIGH (ref 39.0–52.0)
Hemoglobin: 20.6 g/dL — ABNORMAL HIGH (ref 13.0–17.0)
MCH: 29.1 pg (ref 26.0–34.0)
MCHC: 32.6 g/dL (ref 30.0–36.0)
MCV: 89.4 fL (ref 80.0–100.0)
Platelets: 152 10*3/uL (ref 150–400)
RBC: 7.07 MIL/uL — ABNORMAL HIGH (ref 4.22–5.81)
RDW: 17.1 % — ABNORMAL HIGH (ref 11.5–15.5)
WBC: 16.8 10*3/uL — ABNORMAL HIGH (ref 4.0–10.5)
nRBC: 0.4 % — ABNORMAL HIGH (ref 0.0–0.2)

## 2019-07-11 LAB — HEMOGLOBIN A1C
Hgb A1c MFr Bld: 5.7 % — ABNORMAL HIGH (ref 4.8–5.6)
Mean Plasma Glucose: 116.89 mg/dL

## 2019-07-11 LAB — HIV ANTIBODY (ROUTINE TESTING W REFLEX): HIV Screen 4th Generation wRfx: NONREACTIVE

## 2019-07-11 LAB — LACTIC ACID, PLASMA
Lactic Acid, Venous: 5.3 mmol/L (ref 0.5–1.9)
Lactic Acid, Venous: 6 mmol/L (ref 0.5–1.9)

## 2019-07-11 LAB — PROCALCITONIN: Procalcitonin: 41.2 ng/mL

## 2019-07-11 MED ORDER — DEXTROSE 5 % IV SOLN: INTRAVENOUS | Status: DC

## 2019-07-11 MED ORDER — SODIUM CHLORIDE 0.9 % IV SOLN
1.0000 g | Freq: Three times a day (TID) | INTRAVENOUS | Status: DC
  Filled 2019-07-11 (×2): qty 1

## 2019-07-11 MED ORDER — ORAL CARE MOUTH RINSE: 15.0000 mL | OROMUCOSAL | Status: DC

## 2019-07-11 MED ORDER — SODIUM BICARBONATE 8.4 % IV SOLN
100.0000 meq | Freq: Once | INTRAVENOUS | Status: AC
  Administered 2019-07-11: 100 meq via INTRAVENOUS

## 2019-07-11 MED ORDER — DIPHENHYDRAMINE HCL 50 MG/ML IJ SOLN: 25.0000 mg | INTRAMUSCULAR | Status: DC | PRN

## 2019-07-11 MED ORDER — CHLORHEXIDINE GLUCONATE 0.12% ORAL RINSE (MEDLINE KIT)
15.0000 mL | Freq: Two times a day (BID) | OROMUCOSAL | Status: DC
  Administered 2019-07-11: 15 mL via OROMUCOSAL

## 2019-07-11 MED ORDER — ACETAMINOPHEN 325 MG PO TABS: 650.0000 mg | ORAL_TABLET | Freq: Four times a day (QID) | ORAL | Status: DC | PRN

## 2019-07-11 MED ORDER — POLYVINYL ALCOHOL 1.4 % OP SOLN
1.0000 [drp] | Freq: Four times a day (QID) | OPHTHALMIC | Status: DC | PRN
  Filled 2019-07-11: qty 15

## 2019-07-11 MED ORDER — GLYCOPYRROLATE 0.2 MG/ML IJ SOLN: 0.2000 mg | INTRAMUSCULAR | Status: DC | PRN

## 2019-07-11 MED ORDER — ACETAMINOPHEN 650 MG RE SUPP: 650.0000 mg | Freq: Four times a day (QID) | RECTAL | Status: DC | PRN

## 2019-07-11 MED ORDER — METRONIDAZOLE IN NACL 5-0.79 MG/ML-% IV SOLN
500.0000 mg | Freq: Three times a day (TID) | INTRAVENOUS | Status: DC
  Administered 2019-07-11: 500 mg via INTRAVENOUS
  Filled 2019-07-11 (×3): qty 100

## 2019-07-11 MED ORDER — SODIUM CHLORIDE 0.9 % IV BOLUS
1000.0000 mL | Freq: Once | INTRAVENOUS | Status: AC
  Administered 2019-07-11: 1000 mL via INTRAVENOUS

## 2019-07-11 MED ORDER — SODIUM CHLORIDE 0.9 % IV SOLN
2.0000 g | Freq: Three times a day (TID) | INTRAVENOUS | Status: DC
  Administered 2019-07-11: 2 g via INTRAVENOUS
  Filled 2019-07-11 (×3): qty 2

## 2019-07-11 MED ORDER — GLYCOPYRROLATE 1 MG PO TABS
1.0000 mg | ORAL_TABLET | ORAL | Status: DC | PRN
  Filled 2019-07-11: qty 1

## 2019-07-16 LAB — CULTURE, BLOOD (ROUTINE X 2)
Culture: NO GROWTH
Culture: NO GROWTH
Special Requests: ADEQUATE

## 2019-08-02 NOTE — Progress Notes (Signed)
HR continues to climb, currently sustaining 164, Dr. Belia Heman made aware. Temp 105, Tylenol administered per tube with no results. Ice packs applied and covers removed. Unable to turn at this time due to critical illness and instability. Pt unresponsive. Made comfortable. Nephew at bedside, Dr. Belia Heman updating him at this time.

## 2019-08-02 NOTE — Progress Notes (Signed)
Spoke again with Preston Edwards. Patient will be ME case based on presenting history, details from death summary and UDS, She will be in later to see him and will contact [facility] to see if he will go to [facility].

## 2019-08-02 NOTE — Progress Notes (Signed)
The Clinical status was relayed to NIECE  in detail.  Updated and notified of patients medical condition.  Patient remains unresponsive and will not open eyes to command.   Explained to family course of therapy and the modalities     Patient with Progressive multiorgan failure with very low chance of meaningful recovery despite all aggressive and optimal medical therapy.  Patient is in the dying  Process associated with suffering.  Family understands the situation.  They have consented and agreed to DNR.  Family are satisfied with Plan of action and management. All questions answered  Additional 30 mins CC time   Lucie Leather, M.D.  [facility]  Medical Director [facility] Medical Director [facility] Cardio-Pulmonary Department

## 2019-08-02 NOTE — Progress Notes (Signed)
CRITICAL CARE NOTE  34 y.o.malewith no known past medical history presents to the emergency department status post cardiac arrest. According to EMS report they found the patient face down at a park-UNKNOWN DOWNTIME pt pulseless with initial cardiac rhythm PEA CPR initiated by Fire Department with ROSC 40 minutes following initiation of ACLS protocol.  During ACLS protocol pt received several rounds of epinephrine, Narcan, an amp of calcium, amp of bicarb and defibrillated twice in V. fib. Upon arrival to [facility] ER pt unresponsive with pulses intact, however bilateral pupils were fixed and dilated.  He required mechanical intubation.  Lab results revealed K+ 5.8, chloride 97, glucose 435, creatinine 1.77, anion gap 18, albumin 2.9, ast 172, alt 84, troponin 102, wbc 20.4, acetaminophen <10, salicylate <7.0, urine drug screen positive for cocaine/cannabinoid/opiate and abg pH 7.05/pCO2 80/pO2 223/acid-base deficit 10.6.  He was subsequently admitted to ICU for additional workup treatment.    SIGNIFICANT EVENTS/RESULTS: 06/8: Pt admitted to ICU with suspected drug overdose requiring mechanical intubation  06/8: CT Head revealed diffuse hypoxic ischemic injury with brain swelling and low-density resulting in pseudo subarachnoid hemorrhage sign. 6/9 signs of severe brain damage   CC  follow up respiratory failure  SUBJECTIVE Patient remains critically ill Prognosis is guarded Severe cardiogenic shock, multiple pressors Severe brain damage   BP (!) 77/67    Pulse (!) 129    Temp 97.6 F (36.4 C) (Axillary)    Resp (!) 30    Ht 6\' 2"  (1.88 m)    Wt 124.7 kg    SpO2 100%    BMI 35.30 kg/m    I/O last 3 completed shifts: In: 2615.2 [I.V.:1539.3; IV Piggyback:1075.9] Out: 350 [Urine:100; Emesis/NG output:250] No intake/output data recorded.  SpO2: 100 % FiO2 (%): 100 %  Estimated body mass index is 35.3 kg/m as calculated from the following:   Height as of this encounter: 6\' 2"  (1.88  m).   Weight as of this encounter: 124.7 kg.  SIGNIFICANT EVENTS   REVIEW OF SYSTEMS  PATIENT IS UNABLE TO PROVIDE COMPLETE REVIEW OF SYSTEMS DUE TO SEVERE CRITICAL ILLNESS        PHYSICAL EXAMINATION:  GENERAL:critically ill appearing, +resp distress HEAD: Normocephalic, atraumatic.  EYES: fixed and dilated  No scleral icterus.  MOUTH: Moist mucosal membrane. NECK: Supple.  PULMONARY: +rhonchi, +wheezing CARDIOVASCULAR: S1 and S2. Regular rate and rhythm. No murmurs, rubs, or gallops.  GASTROINTESTINAL: Soft, nontender, -distended.  Positive bowel sounds.   MUSCULOSKELETAL: No swelling, clubbing, or edema.  NEUROLOGIC: obtunded, GCS<3T SKIN:intact,warm,dry  MEDICATIONS: I have reviewed all medications and confirmed regimen as documented   CULTURE RESULTS   Recent Results (from the past 240 hour(s))  SARS Coronavirus 2 by RT PCR (hospital order, performed in [facility] hospital lab) Nasopharyngeal Nasopharyngeal Swab     Status: None   Collection Time: 2019-07-20  6:58 PM   Specimen: Nasopharyngeal Swab  Result Value Ref Range Status   SARS Coronavirus 2 NEGATIVE NEGATIVE Final    Comment: (NOTE) SARS-CoV-2 target nucleic acids are NOT DETECTED. The SARS-CoV-2 RNA is generally detectable in upper and lower respiratory specimens during the acute phase of infection. The lowest concentration of SARS-CoV-2 viral copies this assay can detect is 250 copies / mL. A negative result does not preclude SARS-CoV-2 infection and should not be used as the sole basis for treatment or other patient management decisions.  A negative result may occur with improper specimen collection / handling, submission of specimen other than nasopharyngeal swab, presence  of viral mutation(s) within the areas targeted by this assay, and inadequate number of viral copies (<250 copies / mL). A negative result must be combined with clinical observations, patient history, and epidemiological  information. Fact Sheet for Patients:   BoilerBrush.com.cy Fact Sheet for Healthcare Providers: https://pope.com/ This test is not yet approved or cleared  by the [place] FDA and has been authorized for detection and/or diagnosis of SARS-CoV-2 by FDA under an Emergency Use Authorization (EUA).  This EUA will remain in effect (meaning this test can be used) for the duration of the COVID-19 declaration under Section 564(b)(1) of the Act, 21 U.S.C. section 360bbb-3(b)(1), unless the authorization is terminated or revoked sooner. Performed at [facility], [address] [place], [place] [address]   MRSA PCR Screening     Status: None   Collection Time: 07/19/2019  8:58 PM   Specimen: Nasopharyngeal  Result Value Ref Range Status   MRSA by PCR NEGATIVE NEGATIVE Final    Comment:        The GeneXpert MRSA Assay (FDA approved for NASAL specimens only), is one component of a comprehensive MRSA colonization surveillance program. It is not intended to diagnose MRSA infection nor to guide or monitor treatment for MRSA infections. Performed at [facility], [address] [place], [place] [address]           IMAGING    CT Head Wo Contrast  Result Date: 07/28/2019 CLINICAL DATA:  Cardiac arrest. EXAM: CT HEAD WITHOUT CONTRAST TECHNIQUE: Contiguous axial images were obtained from the base of the skull through the vertex without intravenous contrast. COMPARISON:  None. FINDINGS: Brain: Diffuse brain edema and swelling with pseudo subarachnoid hemorrhage sign. Findings likely represent diffuse hypoxic ischemic injury. No sign of true hemorrhage. No extra-axial collection. Vascular: No primary vascular lesion. Skull: Normal Sinuses/Orbits: Normal Other: None IMPRESSION: Diffuse hypoxic ischemic injury with brain swelling and low-density resulting in pseudo subarachnoid hemorrhage sign. Electronically Signed   By: Paulina Fusi M.D.   On: 07/13/2019 20:38   DG Chest Port 1 View  Result Date: 08/06/19 CLINICAL DATA:  Acute respiratory distress EXAM: PORTABLE CHEST 1 VIEW COMPARISON:  Yesterday FINDINGS: Normal heart size and mediastinal contours. Endotracheal tube with tip at the clavicular heads. The enteric tube reaches the stomach. Bilateral pulmonary infiltrates. No visible effusion or air leak. IMPRESSION: 1. Unremarkable hardware positioning. 2. Similar pattern of bilateral infiltrates. Electronically Signed   By: Marnee Spring M.D.   On: 08/06/2019 06:02   DG Chest Portable 1 View  Result Date: 07/24/2019 CLINICAL DATA:  Hypoxia, intubated EXAM: PORTABLE CHEST 1 VIEW COMPARISON:  07/24/2019 at 6:19 p.m. FINDINGS: Single frontal view of the chest demonstrates endotracheal tube approximately 3.3 cm above carina. Enteric catheter passes below diaphragm tip excluded by collimation. External defibrillator pads overlie the left chest. Cardiac silhouette is unremarkable. There is worsening vascular congestion and bilateral airspace disease compatible with edema. No effusion or pneumothorax. IMPRESSION: 1. Support devices as above. 2. Worsening vascular congestion and bilateral airspace disease compatible with pulmonary edema. Electronically Signed   By: Sharlet Salina M.D.   On: 07/13/2019 19:59   DG Chest Portable 1 View  Result Date: 07/14/2019 CLINICAL DATA:  34 year old male status post intubation. EXAM: PORTABLE CHEST 1 VIEW COMPARISON:  No priors. FINDINGS: An endotracheal tube is in place with tip at the level of the carina. Nasogastric tube extends into the proximal stomach with side port just distal to the gastroesophageal junction. Defibrillator pads  project over the lower left hemithorax. Lung volumes are low. Vascular crowding related to low lung volumes, without frank pulmonary edema. No acute consolidative airspace disease. No pleural effusions. No pneumothorax. Heart size appears mildly enlarged, likely  accentuated by low lung volumes and portable AP supine technique. Upper mediastinal contours are within normal limits. IMPRESSION: 1. Support apparatus, as above. Please take note of the low lying endotracheal tube and consider withdrawal approximately 5-6 cm for more optimal placement. 2. Cardiomegaly. Electronically Signed   By: Vinnie Langton M.D.   On: 07-31-19 18:44     Nutrition Status:           Indwelling Urinary Catheter continued, requirement due to   Reason to continue Indwelling Urinary Catheter strict Intake/Output monitoring for hemodynamic instability         Ventilator continued, requirement due to severe respiratory failure      ASSESSMENT AND PLAN SYNOPSIS   Severe ACUTE Hypoxic and Hypercapnic Respiratory Failure from acute cardiac arrest and resp with progressive multiorgan failure from COCAINE POISONING -continue Full MV support -continue Bronchodilator Therapy -Wean Fio2 and PEEP as tolerated -VAP/VENT bundle implementation  ACUTE SYSTOLIC CARDIAC ARREST VENT SUPPORT   ACUTE KIDNEY INJURY/Renal Failure -continue Foley Catheter-assess need -Avoid nephrotoxic agents -Follow urine output, BMP -Ensure adequate renal perfusion, optimize oxygenation -Renal dose medications   NEUROLOGY Severe brain damage Signs of brain death Acute toxic metabolic encephalopathy  SHOCK-CARDIOGENIC -use vasopressors to keep MAP>65 -follow ABG and LA -follow up cultures   CARDIAC ICU monitoring   GI GI PROPHYLAXIS as indicated  NUTRITIONAL STATUS Nutrition Status:         DIET-->NPO Constipation protocol as indicated  ENDO - will use ICU hypoglycemic\Hyperglycemia protocol if indicated     ELECTROLYTES -follow labs as needed -replace as needed -pharmacy consultation and following   DVT/GI PRX ordered and assessed TRANSFUSIONS AS NEEDED MONITOR FSBS I Assessed the need for Labs I Assessed the need for Foley I Assessed the need for  Central Venous Line Family Discussion when available I Assessed the need for Mobilization I made an Assessment of medications to be adjusted accordingly Safety Risk assessment completed   CASE DISCUSSED IN MULTIDISCIPLINARY ROUNDS WITH ICU TEAM  Critical Care Time devoted to patient care services described in this note is 35 minutes.   Overall, patient is critically ill, prognosis is guarded.  Patient with Multiorgan failure and at high risk for cardiac arrest and death.   Family ARRIVING FROM [place] PER REPORT  Flora Ratz Patricia Pesa, M.D.  [facility]  Medical Director [place] Director [facility] Cardio-Pulmonary Department

## 2019-08-02 NOTE — Progress Notes (Signed)
Per RN, patient does not need PIV at this time. Patient is DNR and has poor prognosis. Will placed another IV consult if needed.

## 2019-08-02 NOTE — Death Summary Note (Signed)
DEATH SUMMARY   Patient Details  Name: Preston Edwards MRN: [id number] DOB: 08/31/1985  Admission/Discharge Information   Admit Date:  2019/08/04  Date of Death:   Aug 05, 2019   Time of Death:  1330  Length of Stay: 1  Referring Physician: Patient, No Pcp Per   Reason(s) for Hospitalization  CARDIAC ARREST, COCAINE POISONING, ANOXIC BRAIN INJURY  Diagnoses  Preliminary cause of death: ISCHEMIC CARDIOMYOPATHY, ANOXIC BRAIN INJURY Secondary Diagnoses (including complications and co-morbidities):  Active Problems:   Cardiac arrest (HCC)   Acute renal failure (HCC)   Hyperkalemia   Metabolic acidosis   Ischemic brain injury   Cardiogenic shock (HCC)   Cocaine poisoning (HCC)   Polysubstance abuse (HCC)   Hypovolemic shock [facility])   Brief Hospital Course (including significant findings, care, treatment, and services provided and events leading to death)   34 y.o.malewith no known past medical history presents to the emergency department status post cardiac arrest. According to EMS report they found the patient face downat a park-UNKNOWN DOWNTIMEpt pulseless with initial cardiac rhythm PEA CPR initiated by [employer] Department with ROSC 40 minutes following initiation of ACLS protocol. During ACLS protocol pt received several rounds of epinephrine, Narcan, an amp of calcium, amp of bicarb and defibrillated twice in V. fib.Upon arrival to [facility] ER pt unresponsive with pulses intact, however bilateral pupils were fixed and dilated. He required mechanical intubation. Lab results revealed K+ 5.8, chloride 97, glucose 435, creatinine 1.77, anion gap 18, albumin 2.9, ast 172, alt 84, troponin 102, wbc 20.4, acetaminophen <10, salicylate <7.0, urine drug screen positive for cocaine/cannabinoid/opiateand abg pH 7.05/pCO2 80/pO2 223/acid-base deficit 10.6. He was subsequently admitted to ICU for additional workup treatment.   SIGNIFICANTEVENTS/RESULTS: 08-04-2022: Pt admitted to ICU with  suspected drug overdose requiring mechanical intubation  Aug 04, 2022: CT Head revealed diffuse hypoxic ischemic injury with brain swelling and low-density resulting in pseudo subarachnoid hemorrhage sign. Aug 05, 2022 signs of severe brain damage   The Clinical status was relayed to family in detail.  Updated and notified of patients medical condition.   Patient with Progressive multiorgan failure with very low chance of meaningful recovery despite all aggressive and optimal medical therapy.  Patient is in the dying  Process associated with suffering.  Family understands the situation.  They have consented and agreed to DNR. Patient subsequently died, family called to bedside   Pertinent Labs and Studies  Significant Diagnostic Studies CT Head Wo Contrast  Result Date: 08-04-19 CLINICAL DATA:  Cardiac arrest. EXAM: CT HEAD WITHOUT CONTRAST TECHNIQUE: Contiguous axial images were obtained from the base of the skull through the vertex without intravenous contrast. COMPARISON:  None. FINDINGS: Brain: Diffuse brain edema and swelling with pseudo subarachnoid hemorrhage sign. Findings likely represent diffuse hypoxic ischemic injury. No sign of true hemorrhage. No extra-axial collection. Vascular: No primary vascular lesion. Skull: Normal Sinuses/Orbits: Normal Other: None IMPRESSION: Diffuse hypoxic ischemic injury with brain swelling and low-density resulting in pseudo subarachnoid hemorrhage sign. Electronically Signed   By: Paulina Fusi M.D.   On: 08/04/19 20:38   DG Chest Port 1 View  Result Date: 2019-08-05 CLINICAL DATA:  Acute respiratory distress EXAM: PORTABLE CHEST 1 VIEW COMPARISON:  Yesterday FINDINGS: Normal heart size and mediastinal contours. Endotracheal tube with tip at the clavicular heads. The enteric tube reaches the stomach. Bilateral pulmonary infiltrates. No visible effusion or air leak. IMPRESSION: 1. Unremarkable hardware positioning. 2. Similar pattern of bilateral infiltrates.  Electronically Signed   By: Marnee Spring M.D.   On: August 05, 2019  06:02   DG Chest Portable 1 View  Result Date: 07/19/2019 CLINICAL DATA:  Hypoxia, intubated EXAM: PORTABLE CHEST 1 VIEW COMPARISON:  07/29/2019 at 6:19 p.m. FINDINGS: Single frontal view of the chest demonstrates endotracheal tube approximately 3.3 cm above carina. Enteric catheter passes below diaphragm tip excluded by collimation. External defibrillator pads overlie the left chest. Cardiac silhouette is unremarkable. There is worsening vascular congestion and bilateral airspace disease compatible with edema. No effusion or pneumothorax. IMPRESSION: 1. Support devices as above. 2. Worsening vascular congestion and bilateral airspace disease compatible with pulmonary edema. Electronically Signed   By: Sharlet Salina M.D.   On: 07/26/2019 19:59   DG Chest Portable 1 View  Result Date: 07/06/2019 CLINICAL DATA:  34 year old male status post intubation. EXAM: PORTABLE CHEST 1 VIEW COMPARISON:  No priors. FINDINGS: An endotracheal tube is in place with tip at the level of the carina. Nasogastric tube extends into the proximal stomach with side port just distal to the gastroesophageal junction. Defibrillator pads project over the lower left hemithorax. Lung volumes are low. Vascular crowding related to low lung volumes, without frank pulmonary edema. No acute consolidative airspace disease. No pleural effusions. No pneumothorax. Heart size appears mildly enlarged, likely accentuated by low lung volumes and portable AP supine technique. Upper mediastinal contours are within normal limits. IMPRESSION: 1. Support apparatus, as above. Please take note of the low lying endotracheal tube and consider withdrawal approximately 5-6 cm for more optimal placement. 2. Cardiomegaly. Electronically Signed   By: Trudie Reed M.D.   On: 07/24/2019 18:44    Microbiology Recent Results (from the past 240 hour(s))  SARS Coronavirus 2 by RT PCR (hospital  order, performed in [facility] hospital lab) Nasopharyngeal Nasopharyngeal Swab     Status: None   Collection Time: 07/09/2019  6:58 PM   Specimen: Nasopharyngeal Swab  Result Value Ref Range Status   SARS Coronavirus 2 NEGATIVE NEGATIVE Final    Comment: (NOTE) SARS-CoV-2 target nucleic acids are NOT DETECTED. The SARS-CoV-2 RNA is generally detectable in upper and lower respiratory specimens during the acute phase of infection. The lowest concentration of SARS-CoV-2 viral copies this assay can detect is 250 copies / mL. A negative result does not preclude SARS-CoV-2 infection and should not be used as the sole basis for treatment or other patient management decisions.  A negative result may occur with improper specimen collection / handling, submission of specimen other than nasopharyngeal swab, presence of viral mutation(s) within the areas targeted by this assay, and inadequate number of viral copies (<250 copies / mL). A negative result must be combined with clinical observations, patient history, and epidemiological information. Fact Sheet for Patients:   BoilerBrush.com.cy Fact Sheet for Healthcare Providers: https://pope.com/ This test is not yet approved or cleared  by the [place] FDA and has been authorized for detection and/or diagnosis of SARS-CoV-2 by FDA under an Emergency Use Authorization (EUA).  This EUA will remain in effect (meaning this test can be used) for the duration of the COVID-19 declaration under Section 564(b)(1) of the Act, 21 U.S.C. section 360bbb-3(b)(1), unless the authorization is terminated or revoked sooner. Performed at [facility], [address] [place], [place] [address]   MRSA PCR Screening     Status: None   Collection Time: 07/19/2019  8:58 PM   Specimen: Nasopharyngeal  Result Value Ref Range Status   MRSA by PCR NEGATIVE NEGATIVE Final    Comment:        The GeneXpert MRSA  Assay (FDA approved for NASAL specimens only), is one component of a comprehensive MRSA colonization surveillance program. It is not intended to diagnose MRSA infection nor to guide or monitor treatment for MRSA infections. Performed at [facility], [address] [place], [place] [address]     Lab Basic Metabolic Panel: Recent Labs  Lab 07/25/2019 1813 07/18/2019 2200 07-12-2019 0313  NA 137 146* 145  K 5.8* 6.6* 5.2*  CL 97* 102 99  CO2 22 33* 31  GLUCOSE 435* 168* 120*  BUN 8 12 19   CREATININE 1.77* 1.67* 2.12*  CALCIUM 9.3 8.3* 8.4*  MG  --  2.4  --   PHOS  --  7.1*  --    Liver Function Tests: Recent Labs  Lab 07/27/2019 1813  AST 172*  ALT 84*  ALKPHOS 96  BILITOT 1.3*  PROT 6.0*  ALBUMIN 2.9*   No results for input(s): LIPASE, AMYLASE in the last 168 hours. Recent Labs  Lab 07/23/2019 2200  AMMONIA 84*   CBC: Recent Labs  Lab 07/22/2019 1813 July 12, 2019 0313  WBC 20.4* 16.8*  HGB 15.0 20.6*  HCT 49.8 63.2*  MCV 97.6 89.4  PLT 182 152   Cardiac Enzymes: No results for input(s): CKTOTAL, CKMB, CKMBINDEX, TROPONINI in the last 168 hours. Sepsis Labs: Recent Labs  Lab 07/04/2019 1813 07/09/2019 2200 07/12/2019 0313 2019-07-12 0744  PROCALCITON  --   --  41.20  --   WBC 20.4*  --  16.8*  --   LATICACIDVEN  --  4.0* 5.3* 6.0*     Adrina Armijo 07-12-19, 1:28 PM

## 2019-08-02 NOTE — Progress Notes (Signed)
Patient is maxed out on all pressors at this time. Blood pressure continues to decline rapidly. This RN contacted the patients cousin/room mate, Molly Maduro at [phone] who states he will try to come to see the patient this evening. This RN also contacted the patients nephew, Sheria Lang who states he was not planning on coming back to see the patient today. The above family members are aware of the patients condition and the likelihood of the patient passing soon.

## 2019-08-02 NOTE — Consult Note (Signed)
Reason for Consult:Anoxic brain injury Requesting Physician: Belia Heman  CC: Unresponsive  I have been asked by Dr. Belia Heman to see this patient in consultation for anoxic brain injury.  HPI: Preston Edwards is an 34 y.o. male who is unable to provide any history due to mental status therefore all history obtained from the chart.  Patient with no known past medical history.  According to EMS report they found the patient face down, CPR had been initiated by fire department upon their arrival.  Patient received several rounds of epinephrine, Narcan, an amp of calcium, amp of bicarb and defibrillated twice in V. fib.  ROSC was achieved and patient was brought to the emergency department via emergency traffic.  Puils were fixed and dilated on arrival.  No past medical history on file.  No past surgical history on file.  Family history: Both parents deceased of unknown causes  Social History:  reports that he has been smoking. He does not have any smokeless tobacco history on file. He reports current alcohol use. No history on file for drug.  Allergies  Allergen Reactions  . Penicillins Other (See Comments)    Pt states he was just told this as a child, has never taken it as far as he knows.     Medications: Unknown  ROS: Unable to obtain due to mental status  Physical Examination: Blood pressure (!) 147/124, pulse (!) 164, temperature (!) 107.2 F (41.8 C), resp. rate (!) 30, height 6\' 2"  (1.88 m), weight 124.7 kg, SpO2 99 %.  HEENT-  Normocephalic, no lesions, without obvious abnormality.  Normal external eye and conjunctiva.  Normal TM's bilaterally.  Normal auditory canals and external ears. Normal external nose, mucus membranes and septum.  Normal pharynx. Cardiovascular- S1, S2 normal, pulses palpable throughout   Lungs- chest clear, no wheezing, rales, normal symmetric air entry Abdomen- soft, non-tender; bowel sounds normal; no masses,  no organomegaly Extremities- no edema Lymph-no  adenopathy palpable Musculoskeletal-no joint tenderness, deformity or swelling Skin-warm and dry, no hyperpigmentation, vitiligo, or suspicious lesions  Neurological Examination   Mental Status: Patient does not respond to verbal stimuli.  Does not respond to deep sternal rub.  Does not follow commands.  No verbalizations noted.  Cranial Nerves: II: patient does not respond confrontation bilaterally, pupils right 5 mm, left 5 mm,and unreactive bilaterally III,IV,VI: Oculocephalic response absent bilaterally.  V,VII: corneal reflex absent bilaterally  VIII: patient does not respond to verbal stimuli IX,X: gag reflex absent, XI: trapezius strength unable to test bilaterally XII: tongue strength unable to test Motor: Extremities flaccid throughout.  No spontaneous movement noted.  No purposeful movements noted. Sensory: Does not respond to noxious stimuli in any extremity. Deep Tendon Reflexes:  Absent throughout. Plantars: absent bilaterally Cerebellar: Unable to perform  Laboratory Studies:   Basic Metabolic Panel: Recent Labs  Lab 07/13/2019 1813 07/14/2019 2200 2019-07-14 0313  NA 137 146* 145  K 5.8* 6.6* 5.2*  CL 97* 102 99  CO2 22 33* 31  GLUCOSE 435* 168* 120*  BUN 8 12 19   CREATININE 1.77* 1.67* 2.12*  CALCIUM 9.3 8.3* 8.4*  MG  --  2.4  --   PHOS  --  7.1*  --     Liver Function Tests: Recent Labs  Lab 07/26/2019 1813  AST 172*  ALT 84*  ALKPHOS 96  BILITOT 1.3*  PROT 6.0*  ALBUMIN 2.9*   No results for input(s): LIPASE, AMYLASE in the last 168 hours. Recent Labs  Lab 07/25/2019 2200  AMMONIA 84*    CBC: Recent Labs  Lab 07/22/2019 1813 07/14/2019 0313  WBC 20.4* 16.8*  HGB 15.0 20.6*  HCT 49.8 63.2*  MCV 97.6 89.4  PLT 182 152    Cardiac Enzymes: No results for input(s): CKTOTAL, CKMB, CKMBINDEX, TROPONINI in the last 168 hours.  BNP: Invalid input(s): POCBNP  CBG: Recent Labs  Lab 2019/07/22 2043 07/06/2019 0053 07/12/2019 0149 07/03/2019 0434  07/19/2019 0721  GLUCAP 268* 122* 129* 146* 96    Microbiology: Results for orders placed or performed during the hospital encounter of July 22, 2019  SARS Coronavirus 2 by RT PCR (hospital order, performed in [facility] hospital lab) Nasopharyngeal Nasopharyngeal Swab     Status: None   Collection Time: Jul 22, 2019  6:58 PM   Specimen: Nasopharyngeal Swab  Result Value Ref Range Status   SARS Coronavirus 2 NEGATIVE NEGATIVE Final    Comment: (NOTE) SARS-CoV-2 target nucleic acids are NOT DETECTED. The SARS-CoV-2 RNA is generally detectable in upper and lower respiratory specimens during the acute phase of infection. The lowest concentration of SARS-CoV-2 viral copies this assay can detect is 250 copies / mL. A negative result does not preclude SARS-CoV-2 infection and should not be used as the sole basis for treatment or other patient management decisions.  A negative result may occur with improper specimen collection / handling, submission of specimen other than nasopharyngeal swab, presence of viral mutation(s) within the areas targeted by this assay, and inadequate number of viral copies (<250 copies / mL). A negative result must be combined with clinical observations, patient history, and epidemiological information. Fact Sheet for Patients:   BoilerBrush.com.cy Fact Sheet for Healthcare Providers: https://pope.com/ This test is not yet approved or cleared  by the [place] FDA and has been authorized for detection and/or diagnosis of SARS-CoV-2 by FDA under an Emergency Use Authorization (EUA).  This EUA will remain in effect (meaning this test can be used) for the duration of the COVID-19 declaration under Section 564(b)(1) of the Act, 21 U.S.C. section 360bbb-3(b)(1), unless the authorization is terminated or revoked sooner. Performed at [facility], [address] [place], [place] [address]   MRSA PCR Screening      Status: None   Collection Time: 07-22-19  8:58 PM   Specimen: Nasopharyngeal  Result Value Ref Range Status   MRSA by PCR NEGATIVE NEGATIVE Final    Comment:        The GeneXpert MRSA Assay (FDA approved for NASAL specimens only), is one component of a comprehensive MRSA colonization surveillance program. It is not intended to diagnose MRSA infection nor to guide or monitor treatment for MRSA infections. Performed at [facility], [address] [place], [place] [address]     Coagulation Studies: No results for input(s): LABPROT, INR in the last 72 hours.  Urinalysis:  Recent Labs  Lab 2019/07/22 1813  COLORURINE YELLOW*  LABSPEC 1.016  PHURINE 6.0  GLUCOSEU >=500*  HGBUR NEGATIVE  BILIRUBINUR NEGATIVE  KETONESUR NEGATIVE  PROTEINUR 100*  NITRITE NEGATIVE  LEUKOCYTESUR NEGATIVE    Lipid Panel:  No results found for: CHOL, TRIG, HDL, CHOLHDL, VLDL, LDLCALC  HgbA1C:  Lab Results  Component Value Date   HGBA1C 5.7 (H) Jul 22, 2019    Urine Drug Screen:      Component Value Date/Time   LABOPIA POSITIVE (A) 22-Jul-2019 1813   COCAINSCRNUR POSITIVE (A) 07-22-2019 1813   LABBENZ NONE DETECTED 07-22-2019 1813   AMPHETMU NONE DETECTED Jul 22, 2019 1813   THCU POSITIVE (A) 2019/07/22  [place] 07/31/19 1813    Alcohol Level:  Recent Labs  Lab July 31, 2019 [place] <10    Other results: EKG: sinus tachycardia at 116 bpm.  Imaging: CT Head Wo Contrast  Result Date: 2019/07/31 CLINICAL DATA:  Cardiac arrest. EXAM: CT HEAD WITHOUT CONTRAST TECHNIQUE: Contiguous axial images were obtained from the base of the skull through the vertex without intravenous contrast. COMPARISON:  None. FINDINGS: Brain: Diffuse brain edema and swelling with pseudo subarachnoid hemorrhage sign. Findings likely represent diffuse hypoxic ischemic injury. No sign of true hemorrhage. No extra-axial collection. Vascular: No primary vascular lesion. Skull: Normal  Sinuses/Orbits: Normal Other: None IMPRESSION: Diffuse hypoxic ischemic injury with brain swelling and low-density resulting in pseudo subarachnoid hemorrhage sign. Electronically Signed   By: Nelson Chimes M.D.   On: 31-Jul-2019 20:38   DG Chest Port 1 View  Result Date: 07/24/2019 CLINICAL DATA:  Acute respiratory distress EXAM: PORTABLE CHEST 1 VIEW COMPARISON:  Yesterday FINDINGS: Normal heart size and mediastinal contours. Endotracheal tube with tip at the clavicular heads. The enteric tube reaches the stomach. Bilateral pulmonary infiltrates. No visible effusion or air leak. IMPRESSION: 1. Unremarkable hardware positioning. 2. Similar pattern of bilateral infiltrates. Electronically Signed   By: Monte Fantasia M.D.   On: 07/28/2019 06:02   DG Chest Portable 1 View  Result Date: 31-Jul-2019 CLINICAL DATA:  Hypoxia, intubated EXAM: PORTABLE CHEST 1 VIEW COMPARISON:  2019/07/31 at 6:19 p.m. FINDINGS: Single frontal view of the chest demonstrates endotracheal tube approximately 3.3 cm above carina. Enteric catheter passes below diaphragm tip excluded by collimation. External defibrillator pads overlie the left chest. Cardiac silhouette is unremarkable. There is worsening vascular congestion and bilateral airspace disease compatible with edema. No effusion or pneumothorax. IMPRESSION: 1. Support devices as above. 2. Worsening vascular congestion and bilateral airspace disease compatible with pulmonary edema. Electronically Signed   By: Randa Ngo M.D.   On: 07/31/19 19:59   DG Chest Portable 1 View  Result Date: 07/31/19 CLINICAL DATA:  34 year old male status post intubation. EXAM: PORTABLE CHEST 1 VIEW COMPARISON:  No priors. FINDINGS: An endotracheal tube is in place with tip at the level of the carina. Nasogastric tube extends into the proximal stomach with side port just distal to the gastroesophageal junction. Defibrillator pads project over the lower left hemithorax. Lung volumes are low.  Vascular crowding related to low lung volumes, without frank pulmonary edema. No acute consolidative airspace disease. No pleural effusions. No pneumothorax. Heart size appears mildly enlarged, likely accentuated by low lung volumes and portable AP supine technique. Upper mediastinal contours are within normal limits. IMPRESSION: 1. Support apparatus, as above. Please take note of the low lying endotracheal tube and consider withdrawal approximately 5-6 cm for more optimal placement. 2. Cardiomegaly. Electronically Signed   By: Vinnie Langton M.D.   On: July 31, 2019 18:44     Assessment/Plan: 34 year old male presenting after being found down and unresponsive.  ACLS protocol initiated and ROSC achieved.  Patient on arrival with fixed and dilated pupils.  Patient remains unresponsive.  Head CT reviewed and shows evidence of diffuse hypoxic ischemic injury.  Findings not consistent with functional recovery.  This was explained to nephew.  Patient DNR.  Once family has visited support will be withdrawn.  Nephew is in agreement.    Case discussed with Dr. Tilman Neat, MD Neurology [phone] 07/30/2019, 10:36 AM

## 2019-08-02 NOTE — Progress Notes (Signed)
Pharmacy Antibiotic Note  Preston Edwards is a 34 y.o. male admitted on 07/31/2019 with sepsis.  Pharmacy has been consulted for Aztreonam dosing.  Plan: Aztreonam 2gm IV q8hrs  Height: 6\' 2"  (188 cm) Weight: 124.7 kg (274 lb 14.6 oz) IBW/kg (Calculated) : 82.2  Temp (24hrs), Avg:97.1 F (36.2 C), Min:95.3 F (35.2 C), Max:98 F (36.7 C)  Recent Labs  Lab 07/14/2019 1813 07/14/2019 2200 03-Aug-2019 0313  WBC 20.4*  --  16.8*  CREATININE 1.77* 1.67* 2.12*  LATICACIDVEN  --  4.0* 5.3*    Estimated Creatinine Clearance: 68.2 mL/min (A) (by C-G formula based on SCr of 2.12 mg/dL (H)).    Allergies  Allergen Reactions  . Penicillins Other (See Comments)    Pt states he was just told this as a child, has never taken it as far as he knows.     Antimicrobials this admission:   >>    >>   Dose adjustments this admission:   Microbiology results:  BCx:   UCx:    Sputum:    MRSA PCR:   Thank you for allowing pharmacy to be a part of this patient's care.  09/10/19 A Aug 03, 2019 5:09 AM

## 2019-08-02 NOTE — Progress Notes (Signed)
Pt family on the way to hospital. Pt had large liquid stool. Pt turned to clean up stool. Pt heart stopped at 1330 right after clean up, confirmed by 2 RNs. Family brought to bedside to see pt. CDS called with time of death. AC and MD aware. Per [facility] will be a medical examiner case.

## 2019-08-02 NOTE — Progress Notes (Signed)
RT called to remove pt from vent. Vent removed from room, cleaned and placed in clean equipment room. Vent charges stopped.

## 2019-08-02 NOTE — Progress Notes (Signed)
Leaning towards withdrawing care. No heroic measures per MD.

## 2019-08-02 NOTE — Progress Notes (Signed)
Spoke to niece Dorathy Daft on phone, per Dorathy Daft it is okay to stop all treatment when her brother Preston Edwards arrives to be with the pt. Preston Edwards called and says he will be here asap. Pt is deteriorating rapidly. DNR. Will continue to monitor.

## 2019-08-02 DEATH — deceased

## 2021-03-10 IMAGING — DX DG CHEST 1V PORT
1 series · 1 of 1 positions shown · non-contrast
Comparison: No priors.

CLINICAL DATA: 33-year-old male status post intubation.

EXAM:
PORTABLE CHEST 1 VIEW

[chest ap]
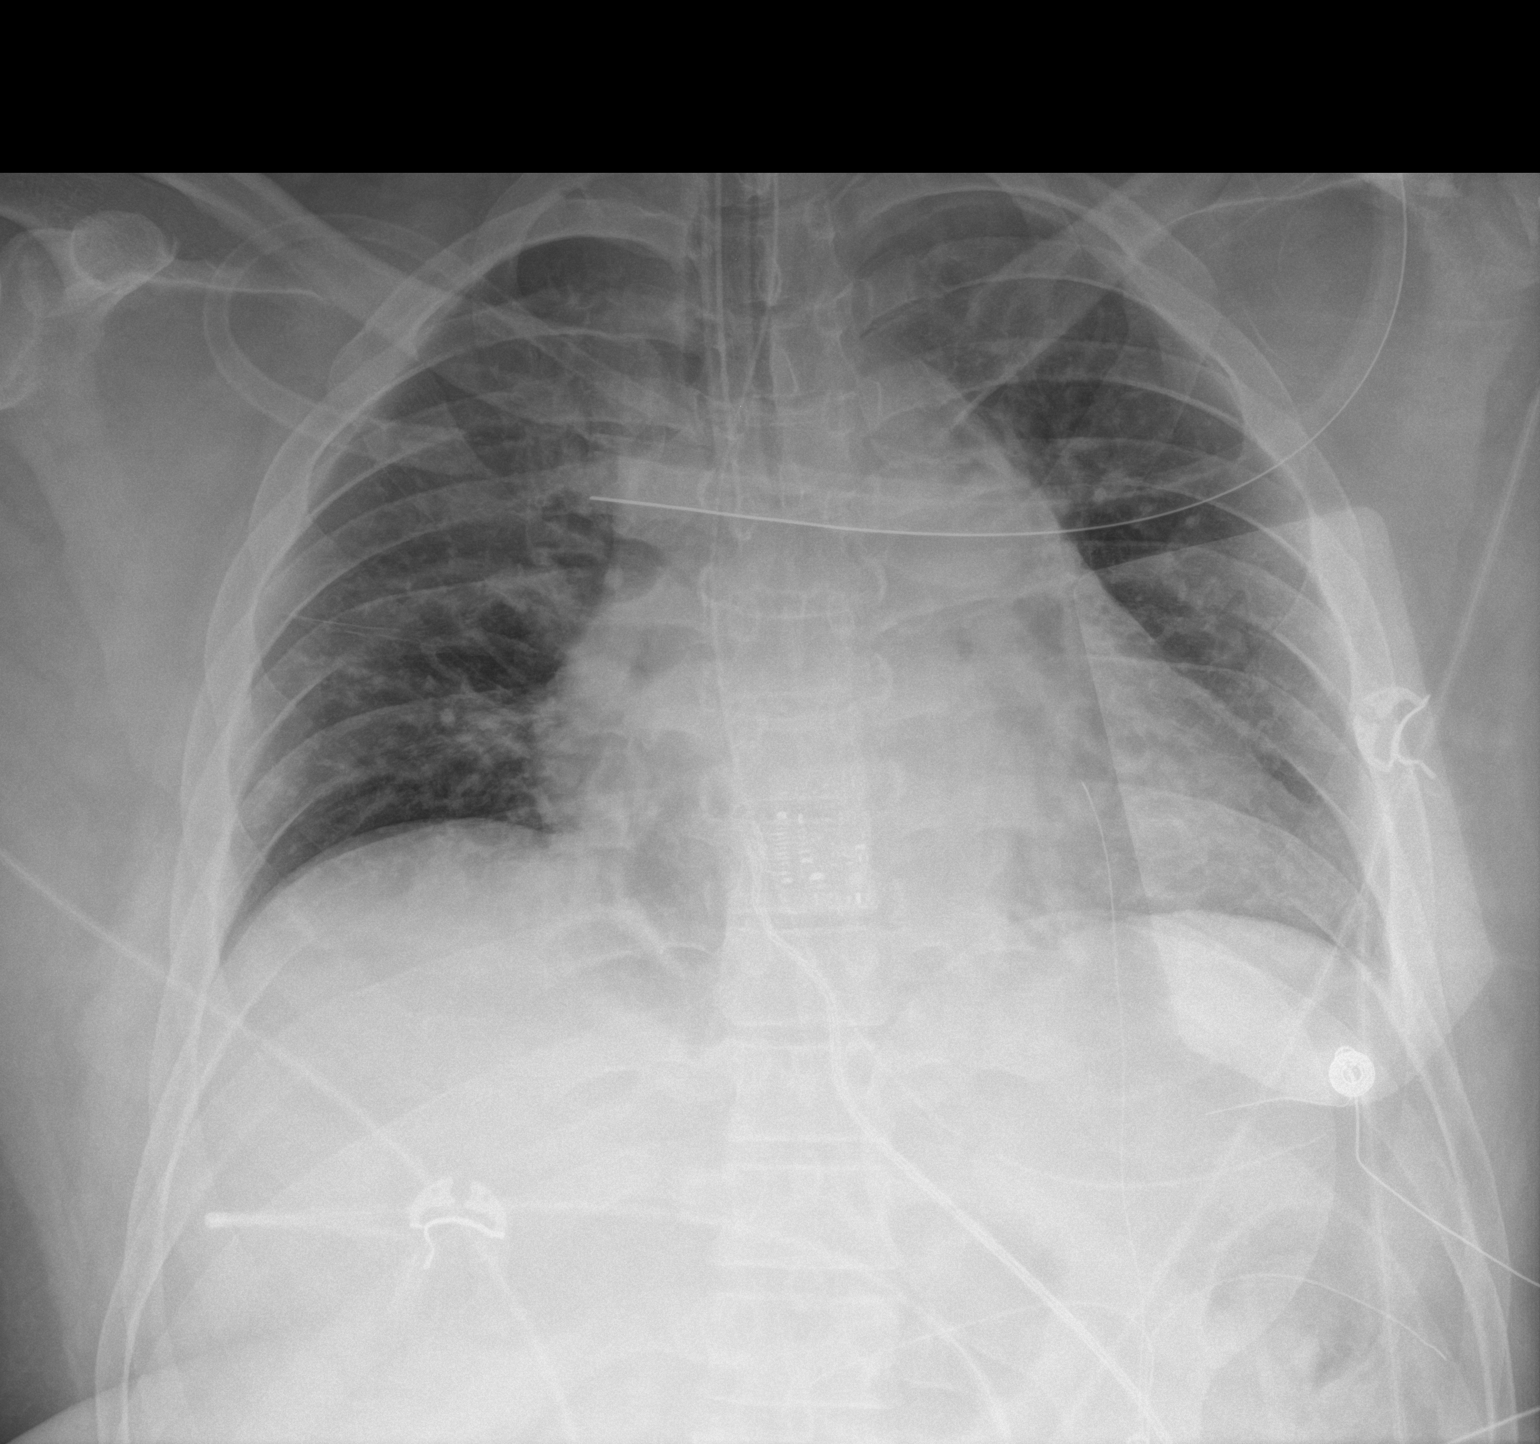

[1 of 1 positions shown; findings below may reference images not displayed]

FINDINGS: An endotracheal tube is in place with tip at the level of the
carina. Nasogastric tube extends into the proximal stomach with side
port just distal to the gastroesophageal junction. Defibrillator
pads project over the lower left hemithorax. Lung volumes are low.
Vascular crowding related to low lung volumes, without frank
pulmonary edema. No acute consolidative airspace disease. No pleural
effusions. No pneumothorax. Heart size appears mildly enlarged,
likely accentuated by low lung volumes and portable AP supine
technique. Upper mediastinal contours are within normal limits.
IMPRESSION: 1. Support apparatus, as above. Please take note of the low lying
endotracheal tube and consider withdrawal approximately 5-6 cm for
more optimal placement.
2. Cardiomegaly.

## 2021-03-10 IMAGING — CT CT HEAD W/O CM
3 series · 16 of 47 positions shown, 19 images · non-contrast
Comparison: None.

CLINICAL DATA: Cardiac arrest.

EXAM:
CT HEAD WITHOUT CONTRAST
TECHNIQUE: Contiguous axial images were obtained from the base of the skull
through the vertex without intravenous contrast.

[Series 2: head wo · axial · 0.49mm/px · z∈[-119,+16]mm · 10 of 33 slices shown, 13 images]
[im 3/33  brain]
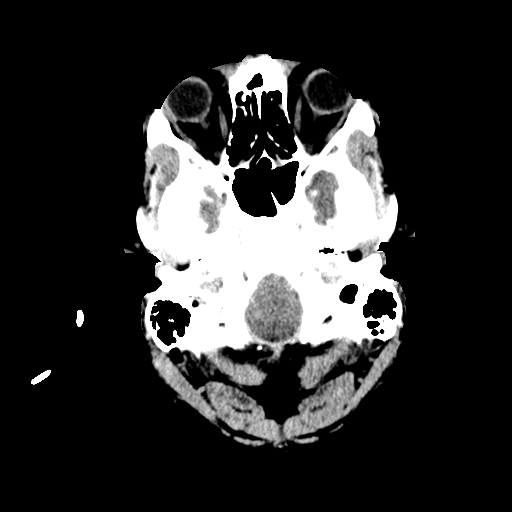
[im 3/33  bone]
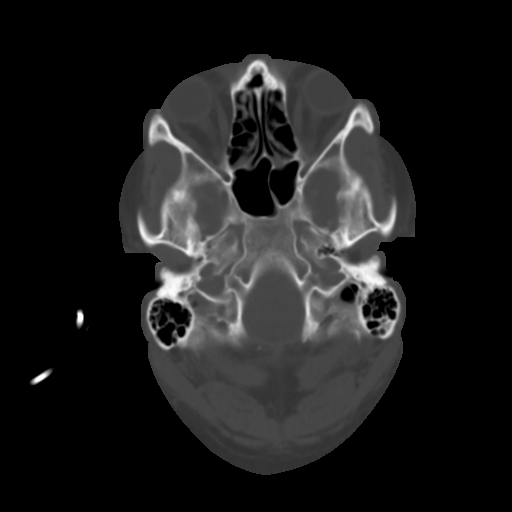
[im 6/33  brain]
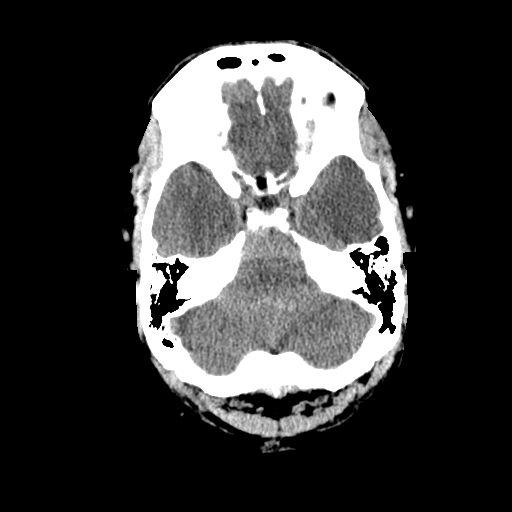
[im 9/33  brain]
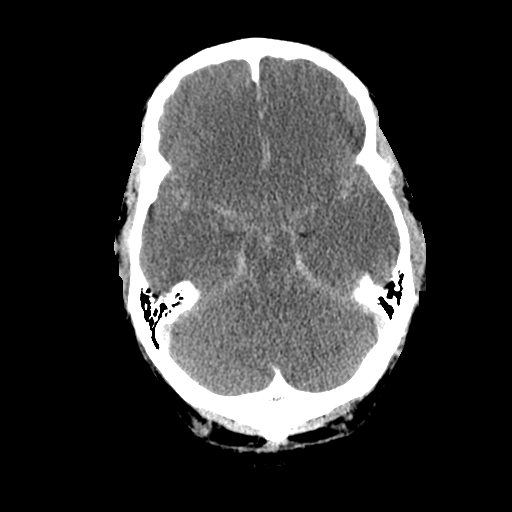
[im 12/33  brain]
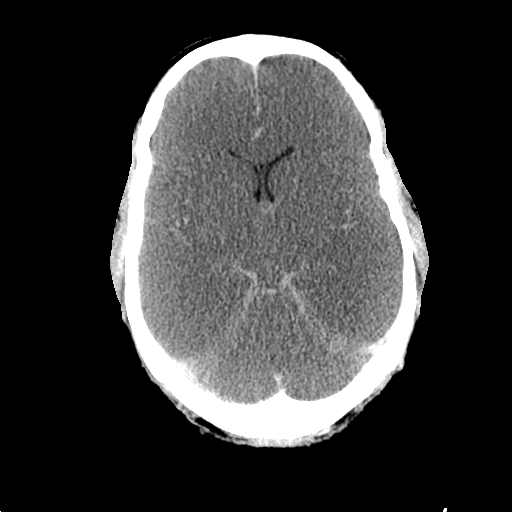
[im 15/33  brain]
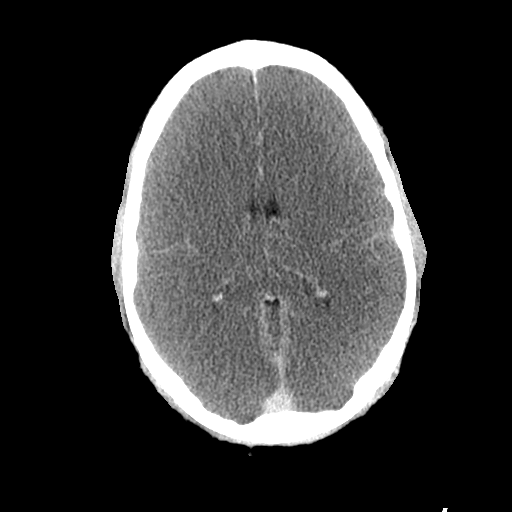
[im 15/33  bone]
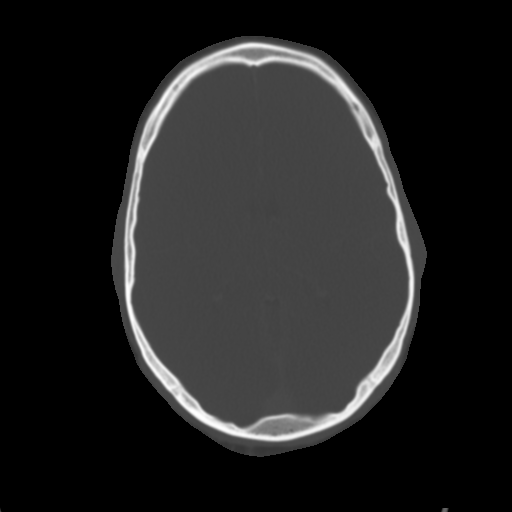
[im 18/33  brain]
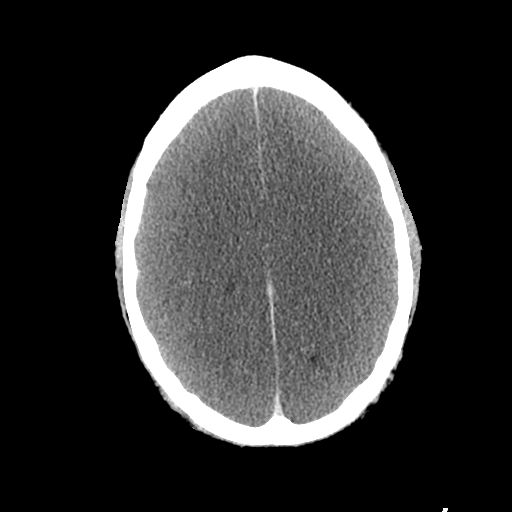
[im 21/33  brain]
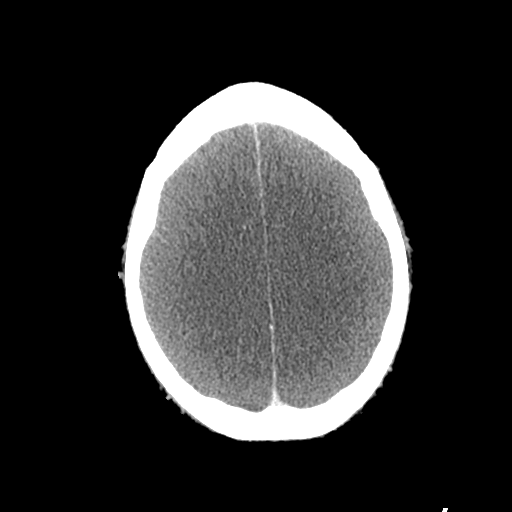
[im 25/33  brain]
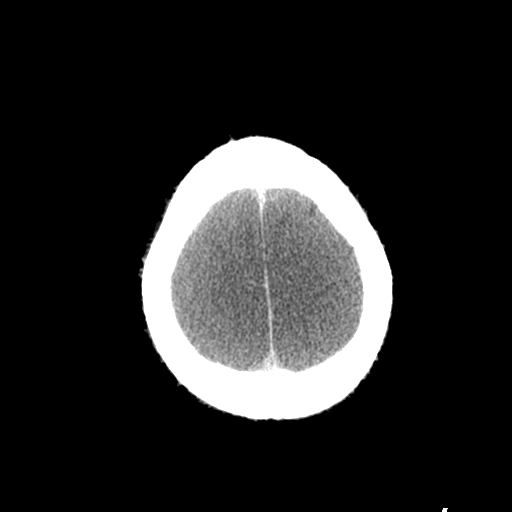
[im 27/33  brain]
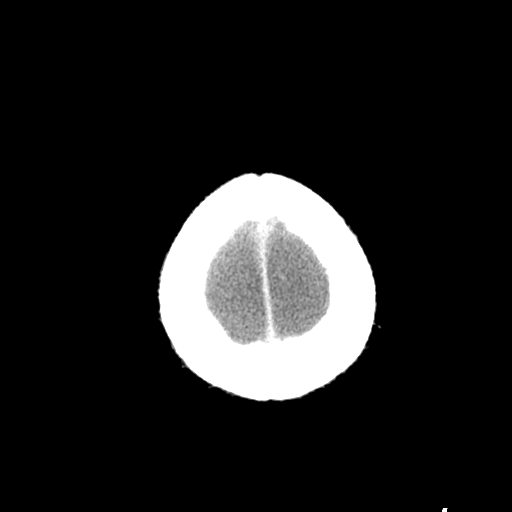
[im 27/33  bone]
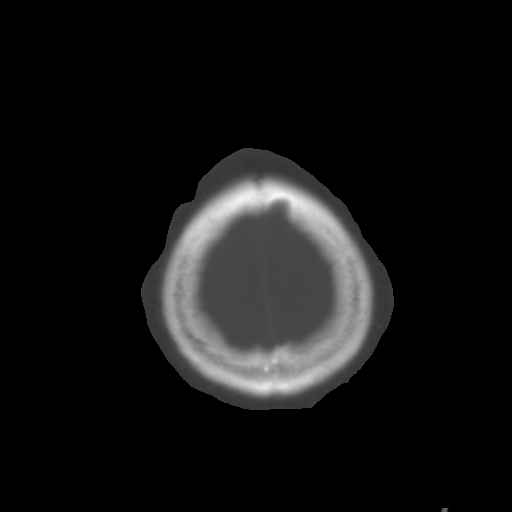
[im 30/33  brain]
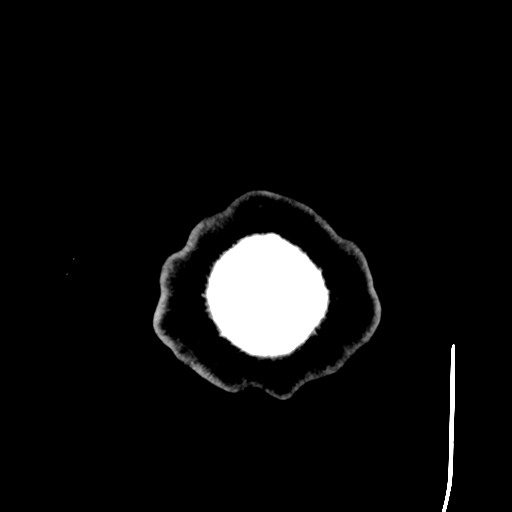

[Series 4: coronal soft tissue · coronal · 0.35mm/px · 3 of 74 slices shown]
[im 25/74  brain]
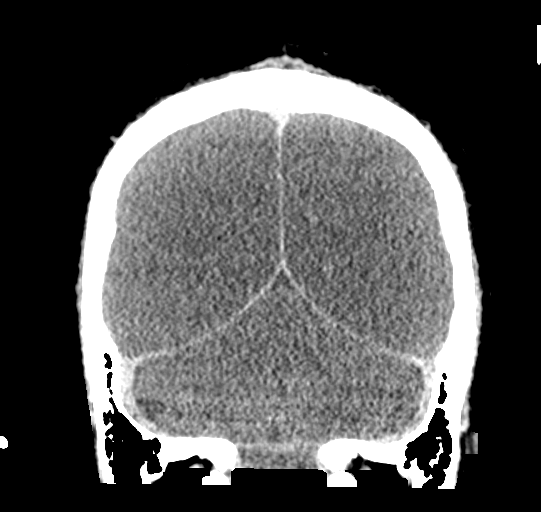
[im 33/74  brain]
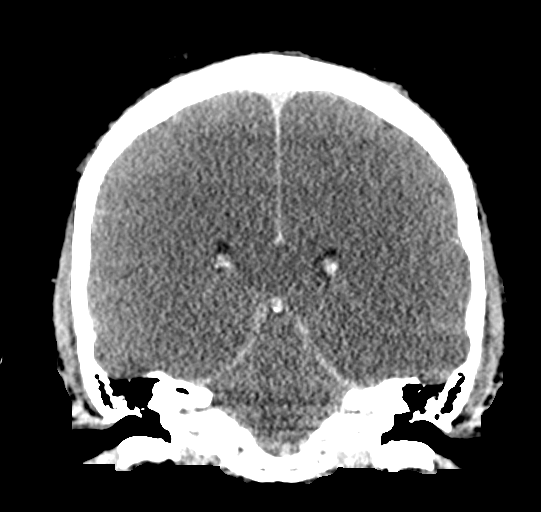
[im 41/74  brain]
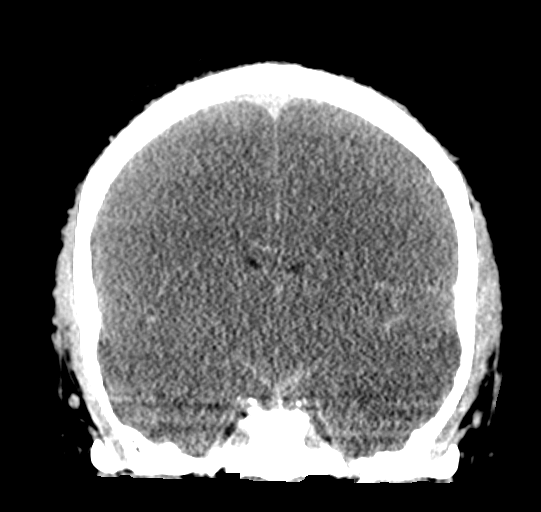

[Series 5: sagittal soft tissue · sagittal · 0.35mm/px · 3 of 57 slices shown]
[im 19/57  brain]
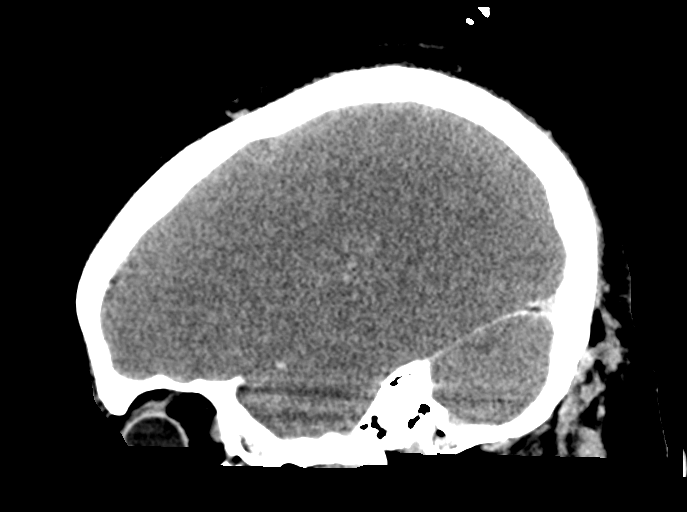
[im 29/57  brain]
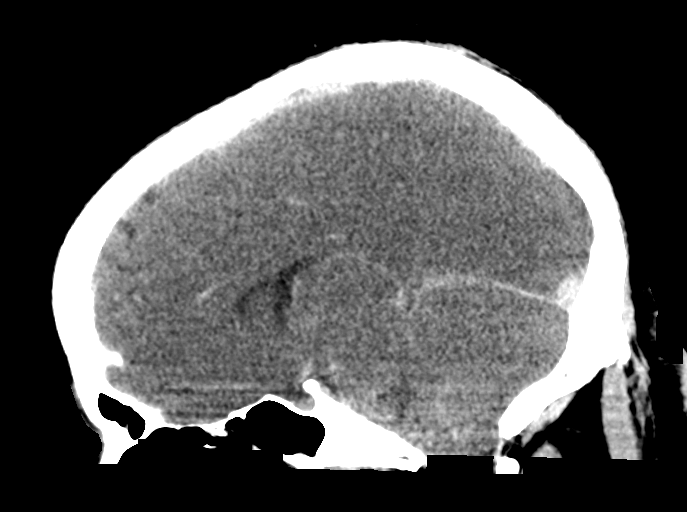
[im 38/57  brain]
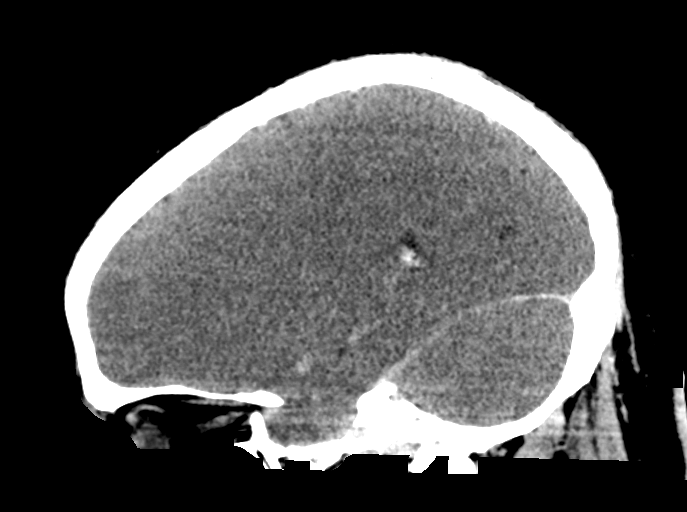

[16 of 47 positions shown; findings below may reference images not displayed]

FINDINGS: Brain: Diffuse brain edema and swelling with pseudo subarachnoid
hemorrhage sign. Findings likely represent diffuse hypoxic ischemic
injury. No sign of true hemorrhage. No extra-axial collection.

Vascular: No primary vascular lesion.

Skull: Normal

Sinuses/Orbits: Normal

Other: None
IMPRESSION: Diffuse hypoxic ischemic injury with brain swelling and low-density
resulting in pseudo subarachnoid hemorrhage sign.

## 2021-03-11 IMAGING — DX DG CHEST 1V PORT
1 series · 1 of 1 positions shown · non-contrast
Comparison: Yesterday

CLINICAL DATA: Acute respiratory distress

EXAM:
PORTABLE CHEST 1 VIEW

[chest ap]
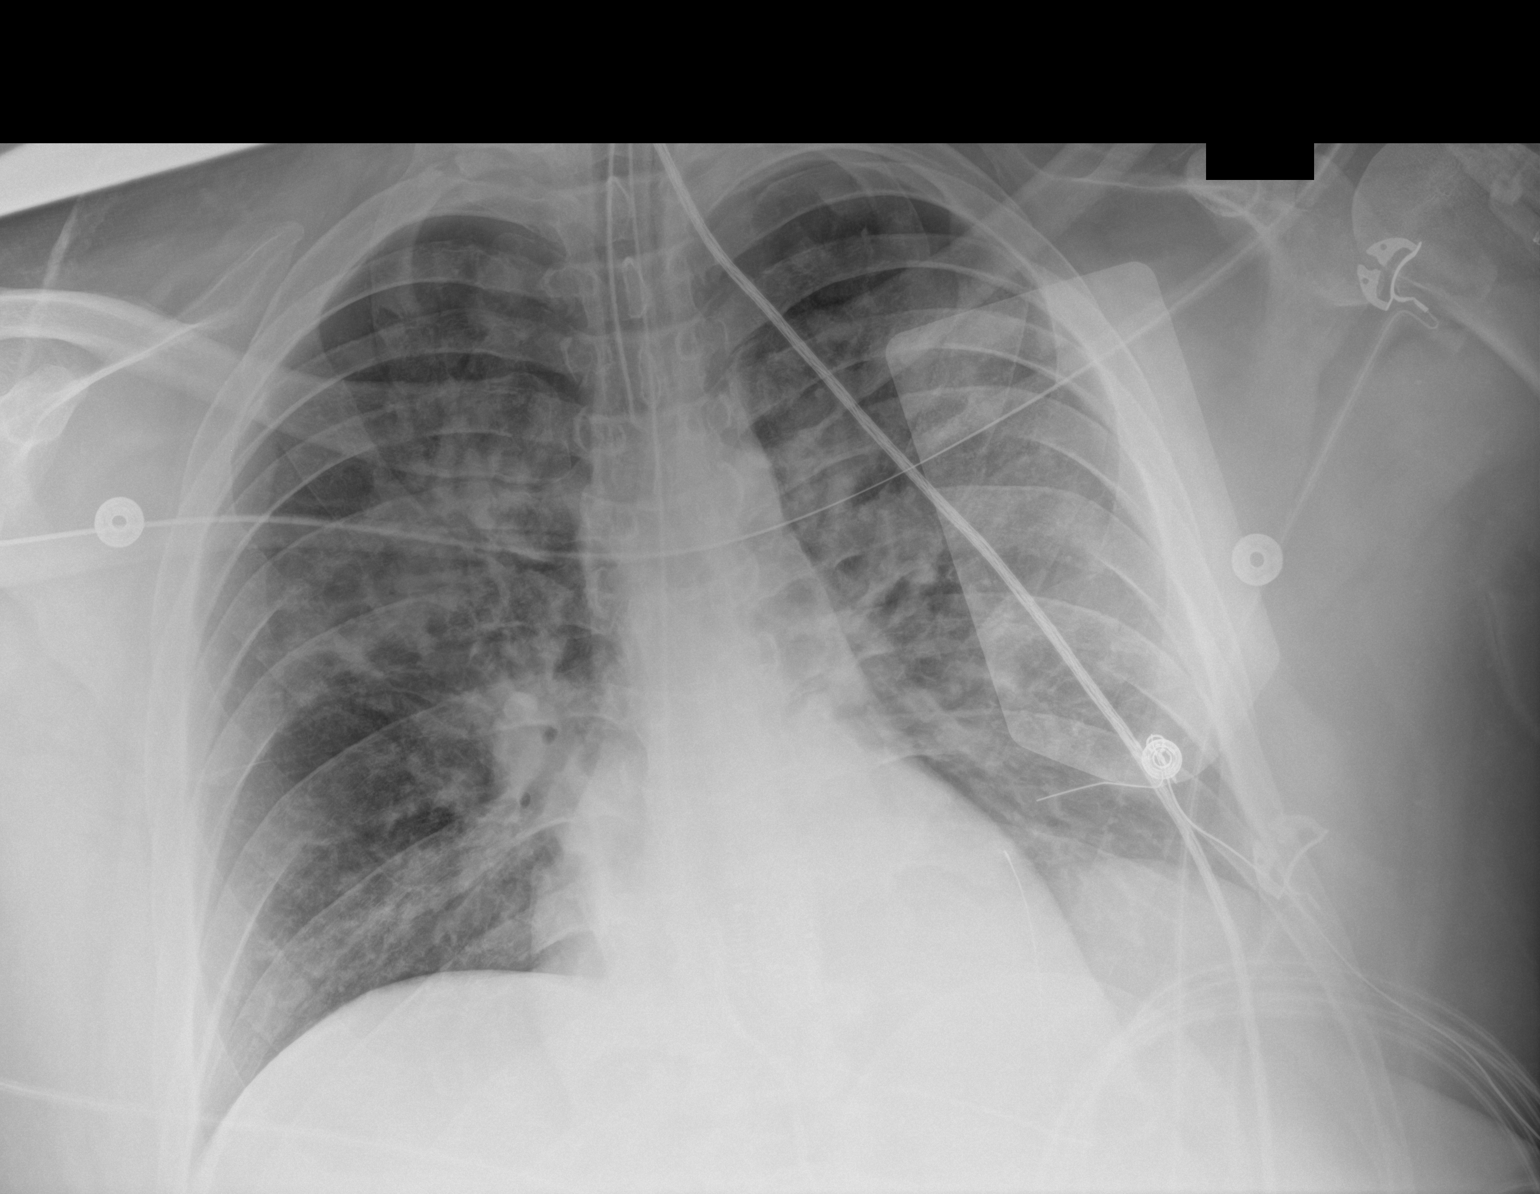

[1 of 1 positions shown; findings below may reference images not displayed]

FINDINGS: Normal heart size and mediastinal contours. Endotracheal tube with
tip at the clavicular heads. The enteric tube reaches the stomach.
Bilateral pulmonary infiltrates. No visible effusion or air leak.
IMPRESSION: 1. Unremarkable hardware positioning.
2. Similar pattern of bilateral infiltrates.
